# Patient Record
Sex: Female | Born: 1995 | Race: Black or African American | Hispanic: No | Marital: Single
Health system: Southern US, Community
[De-identification: ages and names within clinical notes are randomized; demographics above are authoritative.]

## PROBLEM LIST (undated history)

## (undated) DIAGNOSIS — Z789 Other specified health status: Secondary | ICD-10-CM

## (undated) DIAGNOSIS — T7840XA Allergy, unspecified, initial encounter: Secondary | ICD-10-CM

## (undated) HISTORY — PX: HERNIA REPAIR: SHX51

## (undated) HISTORY — DX: Allergy, unspecified, initial encounter: T78.40XA

---

## 2014-05-02 ENCOUNTER — Ambulatory Visit (INDEPENDENT_AMBULATORY_CARE_PROVIDER_SITE_OTHER): Payer: BC Managed Care – PPO | Admitting: Sports Medicine

## 2014-05-02 VITALS — BP 114/68 | HR 71 | Temp 99.0°F | Resp 17 | Ht 65.5 in | Wt 131.0 lb

## 2014-05-02 DIAGNOSIS — N3001 Acute cystitis with hematuria: Secondary | ICD-10-CM

## 2014-05-02 LAB — POCT URINALYSIS DIPSTICK
Bilirubin, UA: NEGATIVE
Glucose, UA: NEGATIVE
NITRITE UA: NEGATIVE
PH UA: 6
PROTEIN UA: 100
Spec Grav, UA: 1.025
Urobilinogen, UA: 1

## 2014-05-02 LAB — POCT UA - MICROSCOPIC ONLY
CRYSTALS, UR, HPF, POC: NEGATIVE
Casts, Ur, LPF, POC: NEGATIVE
Mucus, UA: NEGATIVE
YEAST UA: NEGATIVE

## 2014-05-02 MED ORDER — SULFAMETHOXAZOLE-TRIMETHOPRIM 800-160 MG PO TABS: 1.0000 | ORAL_TABLET | Freq: Two times a day (BID) | ORAL | Status: AC

## 2014-05-02 NOTE — Progress Notes (Signed)
   Subjective:    Patient ID: Rebecca Curtis, female    DOB: 20-Sep-1995, 18 y.o.   MRN: [id number]  HPI Lemont Fillersatyana is a 18yo female who presents with dysuria. Onset was 2 days ago, characterized by dysuria, urgency, and hesitancy.  Possible hematuria as well. No recent antibiotic use. No other vaginal discharge. No vaginal spotting.  LMP 04/19/14. No OCPs, but sexually active. Uses condoms. No STD concern.  No back pain, flank pain, fevers, or chills. Mild nausea. No prior history of UTIs.  Allergies: PCN (throat swelling). Past medical history, social history, medications, and allergies were reviewed and are up to date in the chart.  Review of Systems 7 point review of systems was performed and was otherwise negative unless noted in the history of present illness.     Objective:   Physical Exam BP 114/68  Pulse 71  Temp(Src) 99 F (37.2 C) (Oral)  Resp 17  Ht 5' 5.5" (1.664 m)  Wt 131 lb (59.421 kg)  BMI 21.46 kg/m2  SpO2 100%  LMP 04/19/2014 General appearance: alert, cooperative and appears stated age Back: symmetric, no curvature. ROM normal. No CVA tenderness. Abdomen: soft, non-tender; bowel sounds normal; no masses,  no organomegaly and mild suprapubic tenderness Pulses: 2+ and symmetric Skin: Skin color, texture, turgor normal. No rashes or lesions Lymph nodes: Cervical, supraclavicular, and axillary nodes normal.  UA/Urine dip: + bacteria, +LE, neg nitrite, +WBCs, +RBCs Urine Culture- ordered    Assessment & Plan:  1. UTI, uncomplicated -Urine culture ordered -Rx Bactrim DS bid x 3 days -Discussed precautions, prevention, and reasons to call or RTC.  Dr. Joellyn HaffPick-Jacobs, DO Sports Medicine Fellow

## 2014-05-02 NOTE — Patient Instructions (Signed)

## 2014-05-04 LAB — URINE CULTURE
Colony Count: NO GROWTH
Organism ID, Bacteria: NO GROWTH

## 2014-11-21 ENCOUNTER — Ambulatory Visit (INDEPENDENT_AMBULATORY_CARE_PROVIDER_SITE_OTHER): Payer: BLUE CROSS/BLUE SHIELD | Admitting: Family Medicine

## 2014-11-21 ENCOUNTER — Ambulatory Visit (INDEPENDENT_AMBULATORY_CARE_PROVIDER_SITE_OTHER): Payer: BLUE CROSS/BLUE SHIELD

## 2014-11-21 VITALS — BP 135/90 | HR 80 | Temp 98.8°F | Ht 65.0 in | Wt 131.5 lb

## 2014-11-21 DIAGNOSIS — R079 Chest pain, unspecified: Secondary | ICD-10-CM

## 2014-11-21 NOTE — Progress Notes (Addendum)
Subjective:    Patient ID: Rebecca Curtis, female    DOB: [birth year], 19 y.o.   MRN: [id number] This chart was scribed for Elvina SidleKurt Lauenstein, MD by [place] Peace, ED Scribe. The patient was seen in [facility]07. The patient's care was started at 7:14 PM.  Chief Complaint  Patient presents with   Chest Pain   Nausea    took pepto this am   Shortness of Breath   Arm Pain    left     HPI  HPI Comments: Rebecca Needyatyana Leduc is a 19 y.o. female who presents to the Emergency Department complaining of new onset of left-sided chest pain with SOB and left arm pain/numbness that started today. Pt describes chest pain as feeling as if "someone is sitting on her chest". She denies any recent changes in activity, recent traumas, or mechanisms of injury that may be responsible for current pain. Pt explains that all she has done today was go to work at [employer]. Pt further reports episode of nausea and vomiting this morning around 11:00 AM after eating breakfast 2 hrs before. She currently denies any nausea, vomiting, abdominal pain, or leg pain. Pt notes she tried taking Pepto-Bismol to treat symptoms. She denies any possibility that she may be pregnant.   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 Pt is non-smoker.   Past Medical History  Diagnosis Date   Allergy    History   Social History   Marital Status: Single    Spouse Name: N/A   Number of Children: N/A   Years of Education: N/A   Occupational History   Not on file.   Social History Main Topics   Smoking status: Never Smoker    Smokeless tobacco: Never Used   Alcohol Use: No   Drug Use: No   Sexual Activity: No   Other Topics Concern   Not on file   Social History Narrative   Allergies  Allergen Reactions   Penicillins Anaphylaxis    Heart stopped    Prior to Admission medications   Not on File     Review of Systems  Respiratory: Positive for shortness of breath.   Cardiovascular: Positive for  chest pain.  Gastrointestinal: Negative for nausea, vomiting and abdominal pain.       Objective:   Physical Exam  Constitutional: She is oriented to person, place, and time. She appears well-developed and well-nourished. No distress.  Thin young adult woman in no distress  HENT:  Head: Normocephalic and atraumatic.  Eyes: Conjunctivae and EOM are normal. Pupils are equal, round, and reactive to light.  Neck: Normal range of motion. Neck supple. No tracheal deviation present.  Cardiovascular: Normal rate.   Pulmonary/Chest: Effort normal. No respiratory distress. She exhibits tenderness.  Left, upper chest tenderness.   Abdominal: Soft. Bowel sounds are normal.  Musculoskeletal: Normal range of motion.  Neurological: She is alert and oriented to person, place, and time.  Skin: Skin is warm and dry.  Psychiatric: She has a normal mood and affect. Her behavior is normal. Judgment and thought content normal.  Nursing note and vitals reviewed.  EKG normal sinus rhythm.   7:20 PM- Treatment plan was discussed with patient who verbalizes understanding and agrees.   [facility] reading (PRIMARY) by  Dr. Milus GlazierLauenstein:  Normal chest film.      Assessment & Plan:   This chart was scribed in my presence and reviewed by me personally.    ICD-9-CM ICD-10-CM  1. Chest pain, unspecified chest pain type 786.50 R07.9 EKG 12-Lead     DG Chest 2 View   Ibuprofen for now, return if not better in 2-3 days  Signed, Elvina SidleKurt Lauenstein, MD

## 2014-11-21 NOTE — Patient Instructions (Signed)
The EKG, this goal exam, and chest x-ray also suggests that you have a muscular cause for the pain. This should respond to ibuprofen when taken 3 times a day for 2-3 days. If the pain is persisting or changes significantly, please return for further evaluation   Chest Wall Pain Chest wall pain is pain in or around the bones and muscles of your chest. It may take up to 6 weeks to get better. It may take longer if you must stay physically active in your work and activities.  CAUSES  Chest wall pain may happen on its own. However, it may be caused by:  A viral illness like the flu.  Injury.  Coughing.  Exercise.  Arthritis.  Fibromyalgia.  Shingles. HOME CARE INSTRUCTIONS   Avoid overtiring physical activity. Try not to strain or perform activities that cause pain. This includes any activities using your chest or your abdominal and side muscles, especially if heavy weights are used.  Put ice on the sore area.  Put ice in a plastic bag.  Place a towel between your skin and the bag.  Leave the ice on for 15-20 minutes per hour while awake for the first 2 days.  Only take over-the-counter or prescription medicines for pain, discomfort, or fever as directed by your caregiver. SEEK IMMEDIATE MEDICAL CARE IF:   Your pain increases, or you are very uncomfortable.  You have a fever.  Your chest pain becomes worse.  You have new, unexplained symptoms.  You have nausea or vomiting.  You feel sweaty or lightheaded.  You have a cough with phlegm (sputum), or you cough up blood. MAKE SURE YOU:   Understand these instructions.  Will watch your condition.  Will get help right away if you are not doing well or get worse. Document Released: 06/30/2005 Document Revised: 09/22/2011 Document Reviewed: 02/24/2011 [facility] Patient Information 2015 [place]. This information is not intended to replace advice given to you by your health care provider. Make sure you discuss any  questions you have with your health care provider.

## 2015-01-02 ENCOUNTER — Ambulatory Visit (INDEPENDENT_AMBULATORY_CARE_PROVIDER_SITE_OTHER): Payer: BLUE CROSS/BLUE SHIELD | Admitting: Physician Assistant

## 2015-01-02 VITALS — BP 118/58 | HR 86 | Temp 98.8°F | Resp 16 | Ht 65.5 in | Wt 128.8 lb

## 2015-01-02 DIAGNOSIS — N898 Other specified noninflammatory disorders of vagina: Secondary | ICD-10-CM | POA: Diagnosis not present

## 2015-01-02 DIAGNOSIS — Z30011 Encounter for initial prescription of contraceptive pills: Secondary | ICD-10-CM

## 2015-01-02 DIAGNOSIS — Z309 Encounter for contraceptive management, unspecified: Secondary | ICD-10-CM | POA: Diagnosis not present

## 2015-01-02 LAB — POCT WET PREP WITH KOH
Clue Cells Wet Prep HPF POC: NEGATIVE
KOH Prep POC: NEGATIVE
Trichomonas, UA: NEGATIVE
WBC Wet Prep HPF POC: NEGATIVE
YEAST WET PREP PER HPF POC: NEGATIVE

## 2015-01-02 LAB — POCT URINE PREGNANCY: PREG TEST UR: NEGATIVE

## 2015-01-02 MED ORDER — NORGESTIM-ETH ESTRAD TRIPHASIC 0.18/0.215/0.25 MG-35 MCG PO TABS
1.0000 | ORAL_TABLET | Freq: Every day | ORAL | Status: DC
Start: 2015-01-02 — End: 2017-05-14

## 2015-01-02 NOTE — Progress Notes (Signed)
[facility] [address], [place] [address] [phone]  Date:  01/02/2015   Name:  Rebecca Curtis   DOB:  [birth year]   MRN:  [id number]  PCP:  No PCP Per Patient    History of Present Illness:  Rebecca Curtis is a 19 y.o. female patient who presents to [facility] for chief complaint of abnormal vaginal bleeding. Patient states that today she has had a pinkish creamy discharge.  It is odorless and non-pruritic.  She has never had this before.  It is not enough for a need to use a panty liner, or a sanitary napkin.  She has no abdominal pain.  She did have some nausea this morning.  Last LMP was 3 weeks ago, which she describes was really light.  She states that she is on the birth control Orsythia, but discontinued about 1.5 months ago, due to a change in mood.  This is her first birth control medication, and she has been on this for about 3 months.  She is currently sexually active, to which she continues even after stopping her ocp.  Last sexual encounter was about 2 weeks ago.  She took 2 pregnancy test at her home, which were negative.        There are no active problems to display for this patient.   Past Medical History  Diagnosis Date  . Allergy     Past Surgical History  Procedure Laterality Date  . Hernia repair      History  Substance Use Topics  . Smoking status: Never Smoker   . Smokeless tobacco: Never Used  . Alcohol Use: No    Family History  Problem Relation Age of Onset  . Diabetes Father   . Hyperlipidemia Father   . Diabetes Brother   . Diabetes Maternal Grandmother   . Hyperlipidemia Maternal Grandmother   . Stroke Maternal Grandmother   . Heart disease Paternal Grandmother   . Hyperlipidemia Paternal Grandmother   . Stroke Paternal Grandmother   . Cancer Paternal Grandfather     Allergies  Allergen Reactions  . Penicillins Anaphylaxis    Heart stopped     Medication list has been reviewed and updated.  No current  outpatient prescriptions on file prior to visit.   No current facility-administered medications on file prior to visit.    ROS ROS otherwise unremarkable unless listed above.   Physical Examination: BP 118/58 mmHg  Pulse 86  Temp(Src) 98.8 F (37.1 C) (Oral)  Resp 16  Ht 5' 5.5" (1.664 m)  Wt 128 lb 12.8 oz (58.423 kg)  BMI 21.10 kg/m2  SpO2 93%  LMP 12/13/2014 Ideal Body Weight: Weight in (lb) to have BMI = 25: 152.2  Physical Exam  Constitutional: She appears well-developed and well-nourished. No distress.  Eyes: Pupils are equal, round, and reactive to light.  Cardiovascular: Normal rate, regular rhythm and normal heart sounds.  Exam reveals no friction rub.   No murmur heard. Pulmonary/Chest: Effort normal and breath sounds normal. No respiratory distress. She has no wheezes.  Abdominal: Soft. Bowel sounds are normal. She exhibits no distension. There is no tenderness.  Neurological: She is alert.  Skin: Skin is warm and dry. She is not diaphoretic.  Psychiatric: She has a normal mood and affect. Her behavior is normal.   Results for orders placed or performed in visit on 01/02/15  POCT urine pregnancy  Result Value Ref Range   Preg Test, Ur Negative Negative  POCT Wet  Prep with KOH  Result Value Ref Range   Trichomonas, UA Negative    Clue Cells Wet Prep HPF POC neg    Epithelial Wet Prep HPF POC Moderate Few, Moderate, Many   Yeast Wet Prep HPF POC neg    Bacteria Wet Prep HPF POC Moderate (A) Few   RBC Wet Prep HPF POC TNTC    WBC Wet Prep HPF POC neg    KOH Prep POC Negative      Assessment and Plan: 19 year old female is here today for chief complaint of abnormal bleeding.  Patient did her wet prep, which she states appeared to be her true period starting.  This is likely due to stopping her birth control, and hormone irregularity.  I have advised her that if her period continues, appears abnormal in quantity, abdominal pain, or dizziness, to return to the  clinic.    Vaginal discharge  BCP (birth control pills) initiation - Plan: POCT urine pregnancy, POCT Wet Prep with KOH, GC/Chlamydia Probe Amp, Norgestimate-Ethinyl Estradiol Triphasic 0.18/0.215/0.25 MG-35 MCG tablet  past his g Trena Platt, PA-C [facility] 6/21/20169:12 PM

## 2015-01-02 NOTE — Patient Instructions (Signed)
Dysfunctional Uterine Bleeding Normally, menstrual periods begin between ages 11 to 17 in young women. A normal menstrual cycle/period may begin every 23 days up to 35 days and lasts from 1 to 7 days. Around 12 to 14 days before your menstrual period starts, ovulation (ovary produces an egg) occurs. When counting the time between menstrual periods, count from the first day of bleeding of the previous period to the first day of bleeding of the next period. Dysfunctional (abnormal) uterine bleeding is bleeding that is different from a normal menstrual period. Your periods may come earlier or later than usual. They may be lighter, have blood clots or be heavier. You may have bleeding between periods, or you may skip one period or more. You may have bleeding after sexual intercourse, bleeding after menopause, or no menstrual period. CAUSES   Pregnancy (normal, miscarriage, tubal).  IUDs (intrauterine device, birth control).  Birth control pills.  Hormone treatment.  Menopause.  Infection of the cervix.  Blood clotting problems.  Infection of the inside lining of the uterus.  Endometriosis, inside lining of the uterus growing in the pelvis and other female organs.  Adhesions (scar tissue) inside the uterus.  Obesity or severe weight loss.  Uterine polyps inside the uterus.  Cancer of the vagina, cervix, or uterus.  Ovarian cysts or polycystic ovary syndrome.  Medical problems (diabetes, thyroid disease).  Uterine fibroids (noncancerous tumor).  Problems with your female hormones.  Endometrial hyperplasia, very thick lining and enlarged cells inside of the uterus.  Medicines that interfere with ovulation.  Radiation to the pelvis or abdomen.  Chemotherapy. DIAGNOSIS   Your doctor will discuss the history of your menstrual periods, medicines you are taking, changes in your weight, stress in your life, and any medical problems you may have.  Your doctor will do a physical  and pelvic examination.  Your doctor may want to perform certain tests to make a diagnosis, such as:  Pap test.  Blood tests.  Cultures for infection.  CT scan.  Ultrasound.  Hysteroscopy.  Laparoscopy.  MRI.  Hysterosalpingography.  D and C.  Endometrial biopsy. TREATMENT  Treatment will depend on the cause of the dysfunctional uterine bleeding (DUB). Treatment may include:  Observing your menstrual periods for a couple of months.  Prescribing medicines for medical problems, including:  Antibiotics.  Hormones.  Birth control pills.  Removing an IUD (intrauterine device, birth control).  Surgery:  D and C (scrape and remove tissue from inside the uterus).  Laparoscopy (examine inside the abdomen with a lighted tube).  Uterine ablation (destroy lining of the uterus with electrical current, laser, heat, or freezing).  Hysteroscopy (examine cervix and uterus with a lighted tube).  Hysterectomy (remove the uterus). HOME CARE INSTRUCTIONS   If medicines were prescribed, take exactly as directed. Do not change or switch medicines without consulting your caregiver.  Long term heavy bleeding may result in iron deficiency. Your caregiver may have prescribed iron pills. They help replace the iron that your body lost from heavy bleeding. Take exactly as directed.  Do not take aspirin or medicines that contain aspirin one week before or during your menstrual period. Aspirin may make the bleeding worse.  If you need to change your sanitary pad or tampon more than once every 2 hours, stay in bed with your feet elevated and a cold pack on your lower abdomen. Rest as much as possible, until the bleeding stops or slows down.  Eat well-balanced meals. Eat foods high in iron. Examples   are:  Leafy green vegetables.  Whole-grain breads and cereals.  Eggs.  Meat.  Liver.  Do not try to lose weight until the abnormal bleeding has stopped and your blood iron level is  back to normal. Do not lift more than ten pounds or do strenuous activities when you are bleeding.  For a couple of months, make note on your calendar, marking the start and ending of your period, and the type of bleeding (light, medium, heavy, spotting, clots or missed periods). This is for your caregiver to better evaluate your problem. SEEK MEDICAL CARE IF:   You develop nausea (feeling sick to your stomach) and vomiting, dizziness, or diarrhea while you are taking your medicine.  You are getting lightheaded or weak.  You have any problems that may be related to the medicine you are taking.  You develop pain with your DUB.  You want to remove your IUD.  You want to stop or change your birth control pills or hormones.  You have any type of abnormal bleeding mentioned above.  You are over 16 years old and have not had a menstrual period yet.  You are 19 years old and you are still having menstrual periods.  You have any of the symptoms mentioned above.  You develop a rash. SEEK IMMEDIATE MEDICAL CARE IF:   An oral temperature above 102 F (38.9 C) develops.  You develop chills.  You are changing your sanitary pad or tampon more than once an hour.  You develop abdominal pain.  You pass out or faint. Document Released: 06/27/2000 Document Revised: 09/22/2011 Document Reviewed: 05/29/2009 ExitCare Patient Information 2015 [employer]. This information is not intended to replace advice given to you by your health care provider. Make sure you discuss any questions you have with your health care provider.  

## 2017-05-14 ENCOUNTER — Encounter (HOSPITAL_COMMUNITY): Payer: Self-pay | Admitting: Radiology

## 2017-05-14 ENCOUNTER — Emergency Department (HOSPITAL_COMMUNITY): Payer: BLUE CROSS/BLUE SHIELD

## 2017-05-14 ENCOUNTER — Emergency Department (HOSPITAL_COMMUNITY)
Admission: EM | Admit: 2017-05-14 | Discharge: 2017-05-14 | Disposition: A | Discharge status: Home / Self Care | Payer: BLUE CROSS/BLUE SHIELD | Attending: Emergency Medicine | Admitting: Emergency Medicine

## 2017-05-14 DIAGNOSIS — R1011 Right upper quadrant pain: Secondary | ICD-10-CM | POA: Diagnosis present

## 2017-05-14 DIAGNOSIS — N390 Urinary tract infection, site not specified: Secondary | ICD-10-CM

## 2017-05-14 LAB — COMPREHENSIVE METABOLIC PANEL
ALT: 12 U/L — ABNORMAL LOW (ref 14–54)
ANION GAP: 9 (ref 5–15)
AST: 17 U/L (ref 15–41)
Albumin: 3.5 g/dL (ref 3.5–5.0)
Alkaline Phosphatase: 55 U/L (ref 38–126)
BILIRUBIN TOTAL: 0.7 mg/dL (ref 0.3–1.2)
BUN: 6 mg/dL (ref 6–20)
CALCIUM: 9.1 mg/dL (ref 8.9–10.3)
CO2: 23 mmol/L (ref 22–32)
Chloride: 105 mmol/L (ref 101–111)
Creatinine, Ser: 0.6 mg/dL (ref 0.44–1.00)
GLUCOSE: 88 mg/dL (ref 65–99)
POTASSIUM: 3.9 mmol/L (ref 3.5–5.1)
Sodium: 137 mmol/L (ref 135–145)
Total Protein: 7 g/dL (ref 6.5–8.1)

## 2017-05-14 LAB — CBC
HEMATOCRIT: 32.2 % — AB (ref 36.0–46.0)
HEMOGLOBIN: 10.4 g/dL — AB (ref 12.0–15.0)
MCH: 27.4 pg (ref 26.0–34.0)
MCHC: 32.3 g/dL (ref 30.0–36.0)
MCV: 84.7 fL (ref 78.0–100.0)
Platelets: 281 10*3/uL (ref 150–400)
RBC: 3.8 MIL/uL — AB (ref 3.87–5.11)
RDW: 14 % (ref 11.5–15.5)
WBC: 9.2 10*3/uL (ref 4.0–10.5)

## 2017-05-14 LAB — URINALYSIS, ROUTINE W REFLEX MICROSCOPIC
BILIRUBIN URINE: NEGATIVE
GLUCOSE, UA: NEGATIVE mg/dL
KETONES UR: 5 mg/dL — AB
NITRITE: NEGATIVE
PROTEIN: NEGATIVE mg/dL
Specific Gravity, Urine: 1.018 (ref 1.005–1.030)
pH: 5 (ref 5.0–8.0)

## 2017-05-14 LAB — POC URINE PREG, ED: PREG TEST UR: NEGATIVE

## 2017-05-14 LAB — LIPASE, BLOOD: Lipase: 18 U/L (ref 11–51)

## 2017-05-14 MED ORDER — FENTANYL CITRATE (PF) 100 MCG/2ML IJ SOLN
50.0000 ug | Freq: Once | INTRAMUSCULAR | Status: AC
  Administered 2017-05-14: 50 ug via INTRAVENOUS
  Filled 2017-05-14: qty 2

## 2017-05-14 MED ORDER — ONDANSETRON HCL 4 MG PO TABS: 4.0000 mg | ORAL_TABLET | Freq: Three times a day (TID) | ORAL | 0 refills | Status: AC | PRN

## 2017-05-14 MED ORDER — CIPROFLOXACIN IN D5W 400 MG/200ML IV SOLN
400.0000 mg | Freq: Once | INTRAVENOUS | Status: AC
  Administered 2017-05-14: 400 mg via INTRAVENOUS
  Filled 2017-05-14: qty 200

## 2017-05-14 MED ORDER — HYDROCODONE-ACETAMINOPHEN 5-325 MG PO TABS: 1.0000 | ORAL_TABLET | Freq: Four times a day (QID) | ORAL | 0 refills | Status: DC | PRN

## 2017-05-14 MED ORDER — KETOROLAC TROMETHAMINE 30 MG/ML IJ SOLN
15.0000 mg | Freq: Once | INTRAMUSCULAR | Status: AC
  Administered 2017-05-14: 15 mg via INTRAVENOUS
  Filled 2017-05-14: qty 1

## 2017-05-14 MED ORDER — ONDANSETRON 4 MG PO TBDP: 4.0000 mg | ORAL_TABLET | Freq: Once | ORAL | Status: DC | PRN

## 2017-05-14 MED ORDER — CIPROFLOXACIN HCL 500 MG PO TABS: 500.0000 mg | ORAL_TABLET | Freq: Two times a day (BID) | ORAL | 0 refills | Status: DC

## 2017-05-14 MED ORDER — IOPAMIDOL (ISOVUE-300) INJECTION 61%
INTRAVENOUS | Status: AC
  Administered 2017-05-14: 100 mL
  Filled 2017-05-14: qty 100

## 2017-05-14 NOTE — ED Notes (Signed)
Got patient into a gown patient is resting with call bell in reach 

## 2017-05-14 NOTE — ED Notes (Signed)
Patient transported to CT 

## 2017-05-14 NOTE — ED Triage Notes (Signed)
The pt has had abd pain for 4 days  She weas seen at [facility] yesterday  Still having pain  lmp October 17th

## 2017-05-14 NOTE — ED Provider Notes (Signed)
Emergency Department Provider Note   I have reviewed the triage vital signs and the nursing notes.   HISTORY  Chief Complaint Abdominal Pain   HPI Rebecca Curtis is a 21 y.o. female without significant past medical history presents to the emergency room today with a 4-5 days of worsening right upper quadrant and right flank pain.  Patient states that she takes ibuprofen for this at home it seems to help but continually gets more more prominent to the point where she was crying this morning because of the pain.  She states she was seen in urgent care yesterday had a negative workup and was sent home.  She has mild nausea and some mild urinary symptoms but otherwise no vaginal discharge or vomiting or diarrhea or constipation.  No recent fevers or cough.  States the pain is made worse with movement and with taking deep breaths.  No history of gallbladder problems that she knows of.  No rashes.  No trauma.  No other associated or modifying symptoms.   Past Medical History:  Diagnosis Date  . Allergy     There are no active problems to display for this patient.   Past Surgical History:  Procedure Laterality Date  . HERNIA REPAIR      Current Outpatient Rx  . Order #: [id number] Class: Historical Med  . Order #: [id number] Class: Print  . Order #: [id number] Class: Print  . Order #: [id number] Class: Print    Allergies Penicillins  Family History  Problem Relation Age of Onset  . Diabetes Father   . Hyperlipidemia Father   . Diabetes Brother   . Diabetes Maternal Grandmother   . Hyperlipidemia Maternal Grandmother   . Stroke Maternal Grandmother   . Heart disease Paternal Grandmother   . Hyperlipidemia Paternal Grandmother   . Stroke Paternal Grandmother   . Cancer Paternal Grandfather     Social History Social History  Substance Use Topics  . Smoking status: Never Smoker  . Smokeless tobacco: Never Used  . Alcohol use No    Review of Systems  All other systems  negative except as documented in the HPI. All pertinent positives and negatives as reviewed in the HPI. ____________________________________________   PHYSICAL EXAM:  VITAL SIGNS: ED Triage Vitals [05/14/17 0654]  Enc Vitals Group     BP 134/63     Pulse Rate 80     Resp 18     Temp 99.1 F (37.3 C)     Temp Source Oral     SpO2 100 %     Weight      Height      Head Circumference      Peak Flow      Pain Score      Pain Loc      Pain Edu?      Excl. in GC?     Constitutional: Alert and oriented. Well appearing and in no acute distress. Eyes: Conjunctivae are normal. PERRL. EOMI. Head: Atraumatic. Nose: No congestion/rhinnorhea. Mouth/Throat: Mucous membranes are moist.  Oropharynx non-erythematous. Neck: No stridor.  No meningeal signs.   Cardiovascular: Normal rate, regular rhythm. Good peripheral circulation. Grossly normal heart sounds.  Right chest tenderness. Respiratory: Normal respiratory effort.  No retractions. Lungs CTAB. Gastrointestinal: Mild suprapubic tenderness to palpation.  No distention.  Musculoskeletal: No lower extremity tenderness nor edema. No gross deformities of extremities. Neurologic:  Normal speech and language. No gross focal neurologic deficits are appreciated.  Skin:  Skin is warm, dry and intact.  No rash noted.  ____________________________________________   LABS (all labs ordered are listed, but only abnormal results are displayed)  Labs Reviewed  COMPREHENSIVE METABOLIC PANEL - Abnormal; Notable for the following:       Result Value   ALT 12 (*)    All other components within normal limits  CBC - Abnormal; Notable for the following:    RBC 3.80 (*)    Hemoglobin 10.4 (*)    HCT 32.2 (*)    All other components within normal limits  URINALYSIS, ROUTINE W REFLEX MICROSCOPIC - Abnormal; Notable for the following:    APPearance HAZY (*)    Hgb urine dipstick MODERATE (*)    Ketones, ur 5 (*)    Leukocytes, UA MODERATE (*)     Bacteria, UA MANY (*)    Squamous Epithelial / LPF 0-5 (*)    All other components within normal limits  URINE CULTURE  LIPASE, BLOOD  POC URINE PREG, ED   ____________________________________________  RADIOLOGY  Ct Abdomen Pelvis W Contrast  Result Date: 05/14/2017 CLINICAL DATA:  Right upper quadrant pain for 5 days. Nausea for 3 days. Constipation for 2 days. EXAM: CT ABDOMEN AND PELVIS WITH CONTRAST TECHNIQUE: Multidetector CT imaging of the abdomen and pelvis was performed using the standard protocol following bolus administration of intravenous contrast. CONTRAST:  ISOVUE-300 IOPAMIDOL (ISOVUE-300) INJECTION 61% COMPARISON:  None. FINDINGS: Lower chest: No acute abnormality. Hepatobiliary: No focal liver abnormality is seen. No gallstones, gallbladder wall thickening, or biliary dilatation. Pancreas: Unremarkable. No pancreatic ductal dilatation or surrounding inflammatory changes. Spleen: Normal in size without focal abnormality. Adrenals/Urinary Tract: Adrenal glands are unremarkable. Kidneys are normal, without renal calculi, focal lesion, or hydronephrosis. Bladder is unremarkable. Stomach/Bowel: Stomach is within normal limits. Appendix appears normal. No evidence of bowel wall thickening, distention, or inflammatory changes. Moderate amount of stool in the ascending and transverse colon. Vascular/Lymphatic: No significant vascular findings are present. No enlarged abdominal or pelvic lymph nodes. Reproductive: Uterus and bilateral adnexa are unremarkable. Other: Small amount of pelvic free fluid likely physiologic. No abdominal wall hernia. No fluid collection or hematoma. Musculoskeletal: No acute osseous abnormality. No lytic or sclerotic osseous lesion. IMPRESSION: 1. No acute abdominal or pelvic pathology. 2. Small amount of pelvic free fluid likely physiologic. 3. Moderate amount of stool in the ascending and transverse colon. Electronically Signed   By: Elige Ko   On:  05/14/2017 10:51    ____________________________________________   PROCEDURES  Procedure(s) performed:   Procedures   ____________________________________________   INITIAL IMPRESSION / ASSESSMENT AND PLAN / ED COURSE  Pertinent labs & imaging results that were available during my care of the patient were reviewed by me and considered in my medical decision making (see chart for details).  Patient with suprapubic tenderness, bacteria and leukocytes in her urine likely urinary tract infection.  However she has pain up in her right upper quadrant and right flank possibly pyelonephritis.  Will CT scan to evaluate for the same. abx and pain meds as well.  Ct ok. Likely UTI with early pyelo. Anaphylactic allergy to PCN so will avoid cephalosporin. While cipro is suboptimal 2/2 resistance nitrofurantoin is not appropriate if concerned for pyelo, so will start cipro, send culture.  ____________________________________________  FINAL CLINICAL IMPRESSION(S) / ED DIAGNOSES  Final diagnoses:  Urinary tract infection without hematuria, site unspecified     MEDICATIONS GIVEN DURING THIS VISIT:  Medications  ondansetron (ZOFRAN-ODT) disintegrating tablet 4 mg (not administered)  ciprofloxacin (CIPRO) IVPB 400  mg (0 mg Intravenous Stopped 05/14/17 1117)  fentaNYL (SUBLIMAZE) injection 50 mcg (50 mcg Intravenous Given 05/14/17 0956)  ketorolac (TORADOL) 30 MG/ML injection 15 mg (15 mg Intravenous Given 05/14/17 0956)  iopamidol (ISOVUE-300) 61 % injection (100 mLs  Contrast Given 05/14/17 1031)     NEW OUTPATIENT MEDICATIONS STARTED DURING THIS VISIT:  Discharge Medication List as of 05/14/2017 11:24 AM    START taking these medications   Details  ciprofloxacin (CIPRO) 500 MG tablet Take 1 tablet (500 mg total) by mouth 2 (two) times daily., Starting Thu 05/14/2017, Print    HYDROcodone-acetaminophen (NORCO/VICODIN) 5-325 MG tablet Take 1 tablet by mouth every 6 (six) hours as needed  for moderate pain., Starting Thu 05/14/2017, Print    ondansetron (ZOFRAN) 4 MG tablet Take 1 tablet (4 mg total) by mouth every 8 (eight) hours as needed for nausea or vomiting., Starting Thu 05/14/2017, Print        Note:  This document was prepared using Dragon voice recognition software and may include unintentional dictation errors.   Marily Memos, MD 05/14/17 [phone]

## 2017-05-15 LAB — URINE CULTURE

## 2019-02-05 ENCOUNTER — Other Ambulatory Visit: Payer: Self-pay

## 2019-02-05 ENCOUNTER — Emergency Department (HOSPITAL_BASED_OUTPATIENT_CLINIC_OR_DEPARTMENT_OTHER)
Admission: EM | Admit: 2019-02-05 | Discharge: 2019-02-05 | Disposition: A | Discharge status: Home / Self Care | Payer: BLUE CROSS/BLUE SHIELD | Attending: Emergency Medicine | Admitting: Emergency Medicine

## 2019-02-05 ENCOUNTER — Encounter (HOSPITAL_BASED_OUTPATIENT_CLINIC_OR_DEPARTMENT_OTHER): Payer: Self-pay | Admitting: Emergency Medicine

## 2019-02-05 DIAGNOSIS — N760 Acute vaginitis: Secondary | ICD-10-CM

## 2019-02-05 DIAGNOSIS — Z7722 Contact with and (suspected) exposure to environmental tobacco smoke (acute) (chronic): Secondary | ICD-10-CM | POA: Insufficient documentation

## 2019-02-05 DIAGNOSIS — B9689 Other specified bacterial agents as the cause of diseases classified elsewhere: Secondary | ICD-10-CM

## 2019-02-05 DIAGNOSIS — Z113 Encounter for screening for infections with a predominantly sexual mode of transmission: Secondary | ICD-10-CM | POA: Insufficient documentation

## 2019-02-05 DIAGNOSIS — Z711 Person with feared health complaint in whom no diagnosis is made: Secondary | ICD-10-CM

## 2019-02-05 LAB — URINALYSIS, ROUTINE W REFLEX MICROSCOPIC
Glucose, UA: NEGATIVE mg/dL
Ketones, ur: 40 mg/dL — AB
Leukocytes,Ua: NEGATIVE
Nitrite: NEGATIVE
Protein, ur: 30 mg/dL — AB
Specific Gravity, Urine: 1.025 (ref 1.005–1.030)
pH: 6 (ref 5.0–8.0)

## 2019-02-05 LAB — WET PREP, GENITAL
Sperm: NONE SEEN
Trich, Wet Prep: NONE SEEN
Yeast Wet Prep HPF POC: NONE SEEN

## 2019-02-05 LAB — URINALYSIS, MICROSCOPIC (REFLEX)

## 2019-02-05 LAB — PREGNANCY, URINE: Preg Test, Ur: NEGATIVE

## 2019-02-05 MED ORDER — METRONIDAZOLE 500 MG PO TABS: 500.0000 mg | ORAL_TABLET | Freq: Two times a day (BID) | ORAL | 0 refills | Status: DC

## 2019-02-05 NOTE — Discharge Instructions (Signed)
Take the antibiotics as prescribed.  Do not drink alcohol while taking this medicine for 48 hours after he stopped taking it.  You were tested for gonorrhea, chlamydia, HIV and syphilis.  These test will take 48 hours approximately to return.  If any of these are positive you will need to tell your partners.  You also need to seek reevaluation for treatment.  Follow-up with OB/GYN if you continue having symptoms.  I referred you to a women's outpatient clinic.

## 2019-02-05 NOTE — ED Triage Notes (Signed)
Pt here with vaginal swelling x 2 weeks and requesting "to have her hormone levels checked"

## 2019-02-05 NOTE — ED Provider Notes (Signed)
[facility] EMERGENCY DEPARTMENT Provider Note   CSN: [id number] Arrival date & time: 02/05/19  1222  History   Chief Complaint Chief Complaint  Patient presents with  . Groin Swelling   HPI Rebecca Curtis is a 23 y.o. female with no significant past medical history who presents for evaluation of dental swelling.  Patient states "I feel like the inside my vagina is swollen."  Patient states she is sexually active and does not use protection.  Has had white discharge over the last 2 weeks.  Denies fever, chills, nausea, vomiting, abdominal pain, diarrhea, dysuria, pelvic pain, labial swelling.  Denies history of abscesses or bartholin cyst.  Denies additional aggravating or alleviating factors.  Has not taken anything for her symptoms.  Patient requesting STD testing. Denies additional aggravating or alleviating factors.    History obtained from patient and past medical records.  No interpreter was used.     HPI  Past Medical History:  Diagnosis Date  . Allergy     There are no active problems to display for this patient.   Past Surgical History:  Procedure Laterality Date  . HERNIA REPAIR       OB History   No obstetric history on file.      Home Medications    Prior to Admission medications   Medication Sig Start Date End Date Taking? Authorizing Provider  ciprofloxacin (CIPRO) 500 MG tablet Take 1 tablet (500 mg total) by mouth 2 (two) times daily. 05/14/17   Mesner, Barbara CowerJason, MD  HYDROcodone-acetaminophen (NORCO/VICODIN) 5-325 MG tablet Take 1 tablet by mouth every 6 (six) hours as needed for moderate pain. 05/14/17   Mesner, Barbara CowerJason, MD  ibuprofen (ADVIL,MOTRIN) 200 MG tablet Take 800 mg by mouth every 6 (six) hours as needed for moderate pain.    [provider]  metroNIDAZOLE (FLAGYL) 500 MG tablet Take 1 tablet (500 mg total) by mouth 2 (two) times daily. 02/05/19   Arletha Marschke A, PA-C  ondansetron (ZOFRAN) 4 MG tablet Take 1 tablet (4 mg total)  by mouth every 8 (eight) hours as needed for nausea or vomiting. 05/14/17   Mesner, Barbara CowerJason, MD    Family History Family History  Problem Relation Age of Onset  . Diabetes Father   . Hyperlipidemia Father   . Diabetes Brother   . Diabetes Maternal Grandmother   . Hyperlipidemia Maternal Grandmother   . Stroke Maternal Grandmother   . Heart disease Paternal Grandmother   . Hyperlipidemia Paternal Grandmother   . Stroke Paternal Grandmother   . Cancer Paternal Grandfather     Social History Social History   Tobacco Use  . Smoking status: Passive Smoke Exposure - Never Smoker  . Smokeless tobacco: Never Used  Substance Use Topics  . Alcohol use: No    Alcohol/week: 0.0 standard drinks  . Drug use: No     Allergies   Penicillins   Review of Systems Review of Systems  Constitutional: Negative.   HENT: Negative.   Respiratory: Negative.   Cardiovascular: Negative.   Gastrointestinal: Negative.   Genitourinary: Negative for decreased urine volume, difficulty urinating, dysuria, enuresis, flank pain, frequency, genital sores, hematuria, menstrual problem, pelvic pain, urgency, vaginal bleeding, vaginal discharge and vaginal pain.       Vaginal swelling  Musculoskeletal: Negative.   Skin: Negative.   Neurological: Negative.   All other systems reviewed and are negative.    Physical Exam Updated Vital Signs BP (!) 137/91 (BP Location: Left Arm)   Pulse Marland Kitchen(!)  105   Temp 98.9 F (37.2 C) (Oral)   Resp 20   Ht 5\' 4"  (1.626 m)   Wt 64.4 kg   LMP 01/15/2019 (Exact Date)   SpO2 99%   BMI 24.37 kg/m   Physical Exam Vitals signs and nursing note reviewed. Exam conducted with a chaperone present.  Constitutional:      General: She is not in acute distress.    Appearance: She is not ill-appearing, toxic-appearing or diaphoretic.  HENT:     Head: Normocephalic and atraumatic.     Jaw: There is normal jaw occlusion.     Right Ear: Tympanic membrane, ear canal and  external ear normal. There is no impacted cerumen. No hemotympanum. Tympanic membrane is not injected, scarred, perforated, erythematous, retracted or bulging.     Left Ear: Tympanic membrane, ear canal and external ear normal. There is no impacted cerumen. No hemotympanum. Tympanic membrane is not injected, scarred, perforated, erythematous, retracted or bulging.     Ears:     Comments: No Mastoid tenderness.    Nose: Nose normal.     Comments: Clear rhinorrhea and congestion to bilateral nares.  No sinus tenderness.    Mouth/Throat:     Mouth: Mucous membranes are moist.     Pharynx: Oropharynx is clear.     Comments: Posterior oropharynx clear.  Mucous membranes moist.  Tonsils without erythema or exudate.  Uvula midline without deviation.  No evidence of PTA or RPA.  No drooling, dysphasia or trismus.  Phonation normal. Neck:     Trachea: Trachea and phonation normal.     Meningeal: Brudzinski's sign and Kernig's sign absent.     Comments: No Neck stiffness or neck rigidity.  No meningismus.  No cervical lymphadenopathy. Cardiovascular:     Rate and Rhythm: Normal rate.     Pulses: Normal pulses.     Heart sounds: Normal heart sounds.     Comments: No murmurs rubs or gallops. Pulmonary:     Effort: Pulmonary effort is normal.     Breath sounds: Normal breath sounds.     Comments: Clear to auscultation bilaterally without wheeze, rhonchi or rales.  No accessory muscle usage.  Able speak in full sentences. Abdominal:     General: Bowel sounds are normal. There is no distension.     Tenderness: There is no abdominal tenderness. There is no guarding or rebound.     Comments: Soft, nontender without rebound or guarding.  No CVA tenderness.  Genitourinary:    General: Normal vulva.     Comments: Normal appearing external female genitalia without rashes or lesions, normal vaginal epithelium, No labial swelling, fluctuance or induration. Normal appearing cervix with white/ light yellow  discharge. No cervical petechiae. Cervical os is closed. There is no bleeding noted at the os.No Odor. Bimanual: No CMT, nontender.  No palpable adnexal masses or tenderness. Uterus midline and not fixed. Rectovaginal exam was deferred.  No cystocele or rectocele noted. No pelvic lymphadenopathy noted. Wet prep was obtained.  Cultures for gonorrhea and chlamydia collected. Exam performed with chaperone in room. Musculoskeletal:     Comments: Moves all 4 extremities without difficulty.  Lower extremities without edema, erythema or warmth.  Skin:    Comments: Brisk capillary refill.  No rashes or lesions.  Neurological:     Mental Status: She is alert.     Comments: Ambulatory in department without difficulty.  Cranial nerves II through XII grossly intact.  No facial droop.  No aphasia.  ED Treatments / Results  Labs (all labs ordered are listed, but only abnormal results are displayed) Labs Reviewed  WET PREP, GENITAL - Abnormal; Notable for the following components:      Result Value   Clue Cells Wet Prep HPF POC PRESENT (*)    WBC, Wet Prep HPF POC MODERATE (*)    All other components within normal limits  URINALYSIS, ROUTINE W REFLEX MICROSCOPIC - Abnormal; Notable for the following components:   APPearance CLOUDY (*)    Hgb urine dipstick MODERATE (*)    Bilirubin Urine SMALL (*)    Ketones, ur 40 (*)    Protein, ur 30 (*)    All other components within normal limits  URINALYSIS, MICROSCOPIC (REFLEX) - Abnormal; Notable for the following components:   Bacteria, UA RARE (*)    All other components within normal limits  PREGNANCY, URINE  RPR  HIV ANTIBODY (ROUTINE TESTING W REFLEX)  GC[facility]CHLAM[facility]DIA PROBE AMP ([place]    EKG None  Radiology No results found.  Procedures Procedures (including critical care time)  Medications Ordered in ED Medications - No data to display  Initial Impression / Assessment and Plan / ED Course  I have reviewed the triage  vital signs and the nursing notes.  Pertinent labs & imaging results that were available during my care of the patient were reviewed by me and considered in my medical decision making (see chart for details).  23 year old appears otherwise well presents for evaluation of vaginal swelling. States "it feels like my insides are swollen". Occurs after intercourse. She is afebrile, nonseptic, non-ill-appearing.  She is sexually active and does not use protection.  Has also had white vaginal discharge.  Abdomen soft, nontender without rebound or guarding.  GU exam with white/yellow vaginal discharge in vault.  No evidence of labial or groin swelling.  No evidence of abscess or cellulitis.  No evidence of Bartholin cyst.  No evidence of little hernias.  No fluctuance or induration.  No evidence of skin infection on exam.  Ambulatory that difficulty.  Heart and lungs clear.  Tolerating p.o. intake at home.  No urinary symptoms.  Do not see any evidence of vaginal or pelvic swelling.  Urinalysis negative for infection.  Wet prep with BV and WBC.  GC/chlamydia, HIV and RPR pending.  Patient does not want empiric antibiotics for GC, chlamydia.  Pt understands that they have GC/Chlamydia cultures pending and that they will need to inform all sexual partners if results return positive. Pt not concerning for PID because hemodynamically stable and no cervical motion tenderness on pelvic exam. Pt has also been treated with Flagyl for Bacterial Vaginosis. Pt has been advised to not drink alcohol while on this medication.  Patient to be discharged with instructions to follow up with OBGYN/PCP. Discussed importance of using protection when sexually active.   The patient has been appropriately medically screened and/or stabilized in the ED. I have low suspicion for any other emergent medical condition which would require further screening, evaluation or treatment in the ED or require inpatient management.  Patient is  hemodynamically stable and in no acute distress.  Patient able to ambulate in department prior to ED.  Evaluation does not show acute pathology that would require ongoing or additional emergent interventions while in the emergency department or further inpatient treatment.  I have discussed the diagnosis with the patient and answered all questions.  Pain is been managed while in the emergency department and patient has  no further complaints prior to discharge.  Patient is comfortable with plan discussed in room and is stable for discharge at this time.  I have discussed strict return precautions for returning to the emergency department.  Patient was encouraged to follow-up with PCP/specialist refer to at discharge.     Final Clinical Impressions(s) / ED Diagnoses   Final diagnoses:  Bacterial vaginosis  Concern about STD in female without diagnosis    ED Discharge Orders         Ordered    metroNIDAZOLE (FLAGYL) 500 MG tablet  2 times daily     02/05/19 1526           Can Lucci A, PA-C 02/05/19 1528    Linwood DibblesKnapp, Jon, MD 02/06/19 [phone]

## 2019-02-06 LAB — HIV ANTIBODY (ROUTINE TESTING W REFLEX): HIV Screen 4th Generation wRfx: NONREACTIVE

## 2019-02-07 LAB — RPR: RPR Ser Ql: NONREACTIVE

## 2019-02-08 LAB — GC/CHLAMYDIA PROBE AMP (~~LOC~~) NOT AT ARMC
Chlamydia: POSITIVE — AB
Neisseria Gonorrhea: NEGATIVE

## 2019-02-09 ENCOUNTER — Telehealth: Payer: Self-pay | Admitting: Student

## 2019-02-09 DIAGNOSIS — A749 Chlamydial infection, unspecified: Secondary | ICD-10-CM

## 2019-02-09 MED ORDER — AZITHROMYCIN 500 MG PO TABS: 1000.0000 mg | ORAL_TABLET | Freq: Once | ORAL | 0 refills | Status: AC

## 2019-02-09 NOTE — Telephone Encounter (Addendum)
Rebecca Curtis tested positive for  Chlamydia. Patient was called by RN and allergies and pharmacy confirmed. Rx sent to pharmacy of choice.   Jorje Guild, NP 02/09/2019 10:10 AM       ----- Message from Bjorn Loser, RN sent at 02/08/2019  4:21 PM EDT ----- This patient tested positive for :  chlamydia  She "is allergic to Penicillin",  I have informed the patient of her results and confirmed her pharmacy is correct in her chart. Please send Rx.   Thank you,   Bjorn Loser, RN   Results faxed to [facility] Department.

## 2019-02-23 IMAGING — CT CT ABD-PELV W/ CM
2 of 4 series · 17 of 46 positions shown, 19 images · IV contrast (iopamidol)
Comparison: None.

CLINICAL DATA: Right upper quadrant pain for 5 days. Nausea for 3
days. Constipation for 2 days.

EXAM:
CT ABDOMEN AND PELVIS WITH CONTRAST
TECHNIQUE: Multidetector CT imaging of the abdomen and pelvis was performed
using the standard protocol following bolus administration of
intravenous contrast.
CONTRAST:  100mL W47HGI-1MM IOPAMIDOL (W47HGI-1MM) INJECTION 61%

[Series 3: a/p w/ 5mm · axial · 0.74mm/px · z∈[+1027,+1467]mm · 14 of 96 slices shown, 16 images]
[im 4/96  soft-tissue]
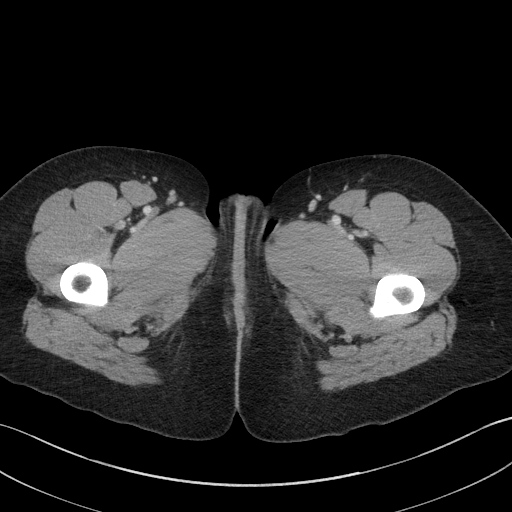
[im 4/96  bone]
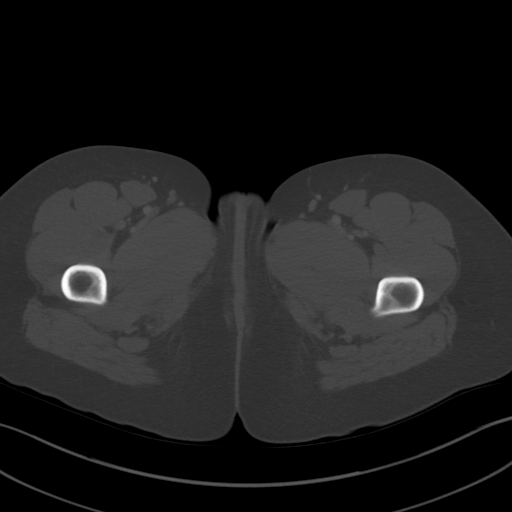
[im 12/96  soft-tissue]
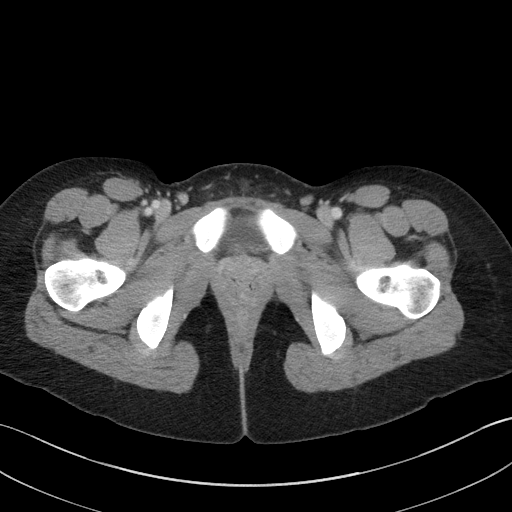
[im 20/96  soft-tissue]
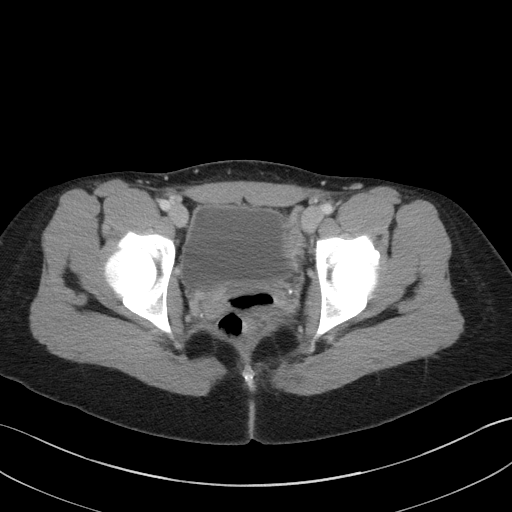
[im 24/96  soft-tissue]
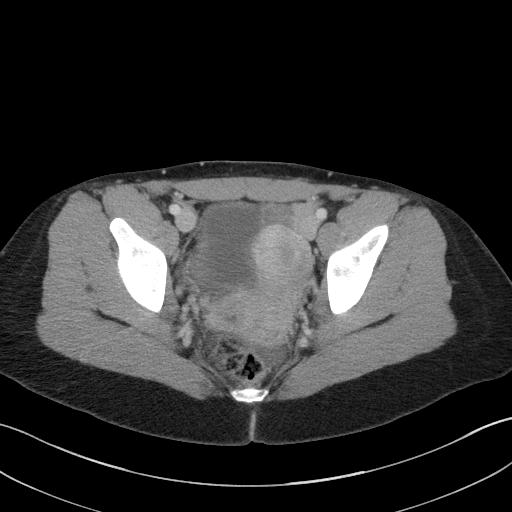
[im 32/96  soft-tissue]
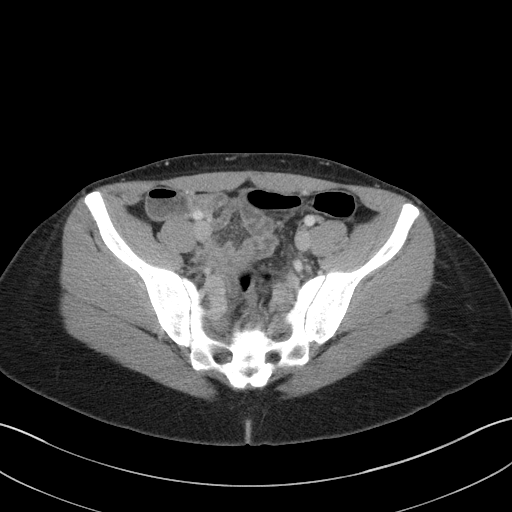
[im 40/96  soft-tissue]
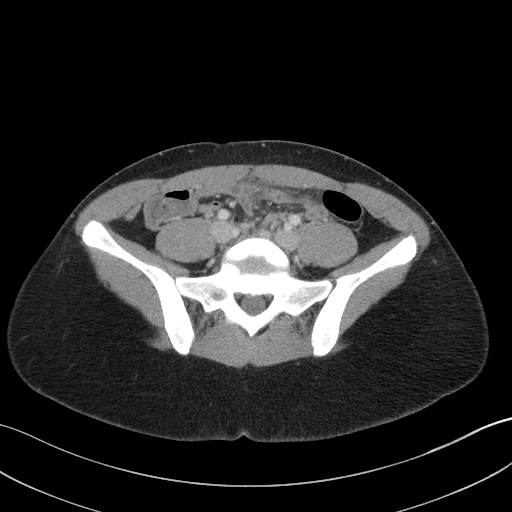
[im 44/96  soft-tissue]
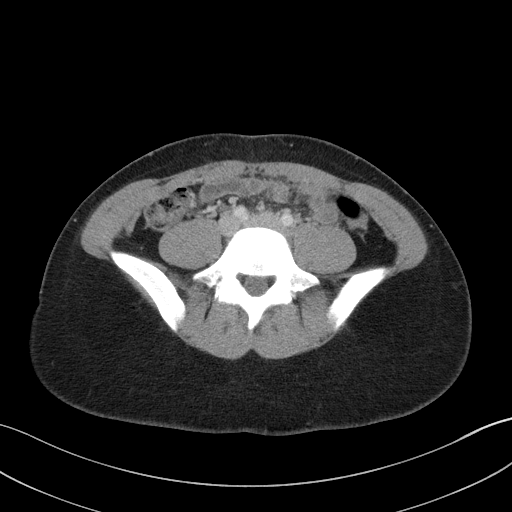
[im 52/96  soft-tissue]
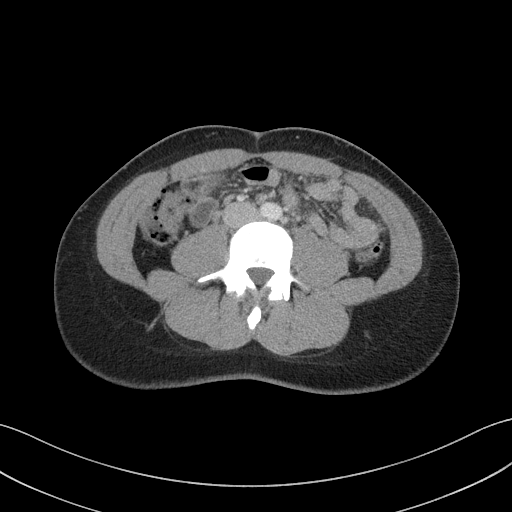
[im 56/96  soft-tissue]
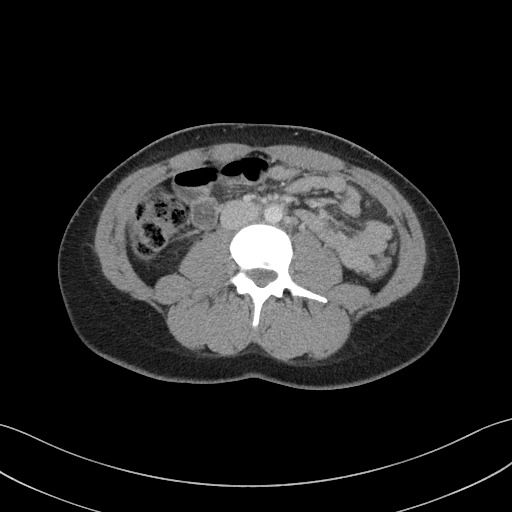
[im 56/96  bone]
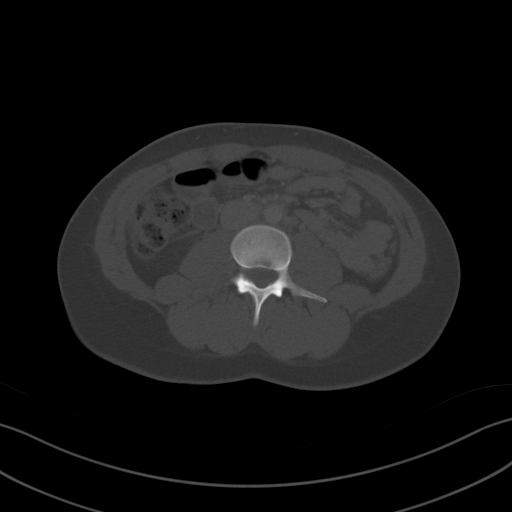
[im 64/96  soft-tissue]
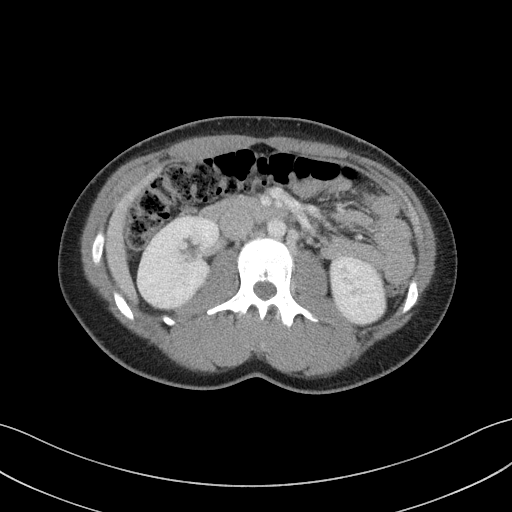
[im 72/96  soft-tissue]
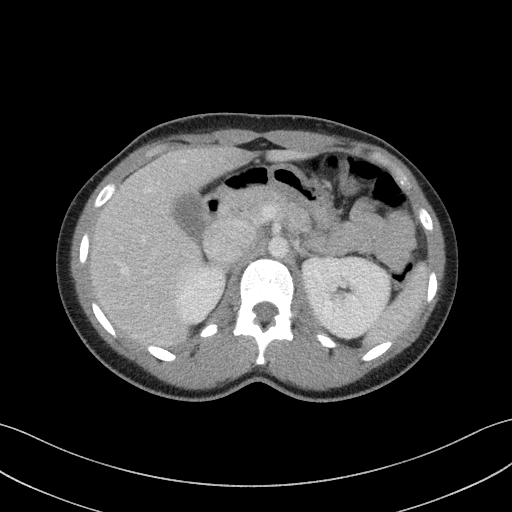
[im 76/96  soft-tissue]
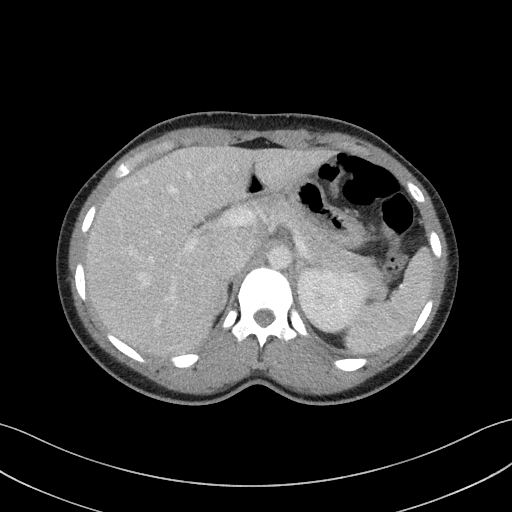
[im 84/96  soft-tissue]
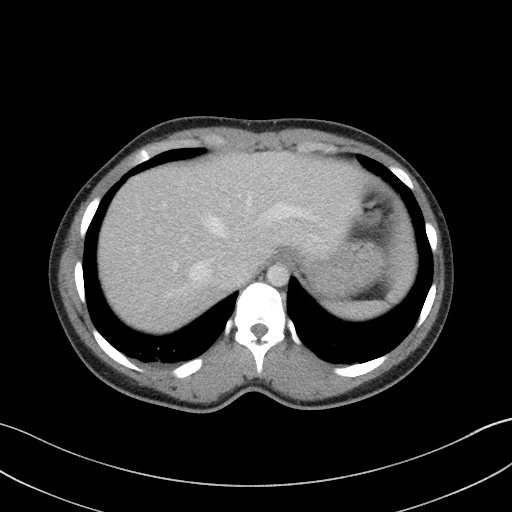
[im 92/96  soft-tissue]
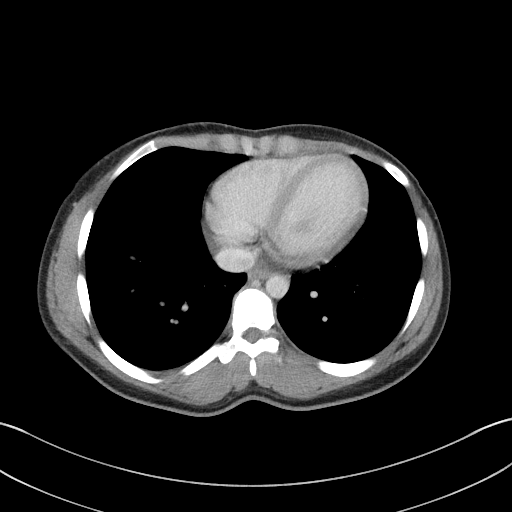

[Series 6: a/p w/ cor · coronal · 0.93mm/px · 3 of 113 slices shown]
[im 38/113  soft-tissue]
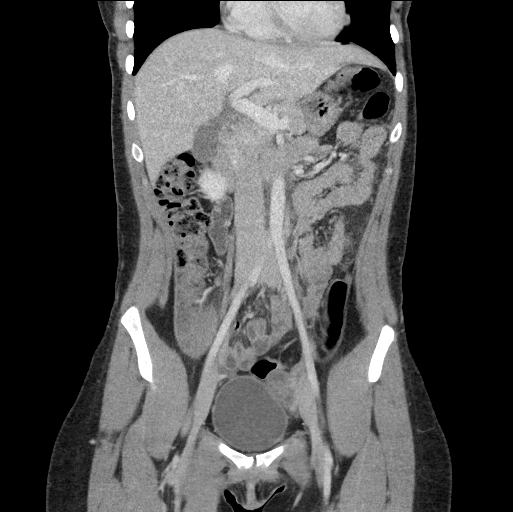
[im 50/113  soft-tissue]
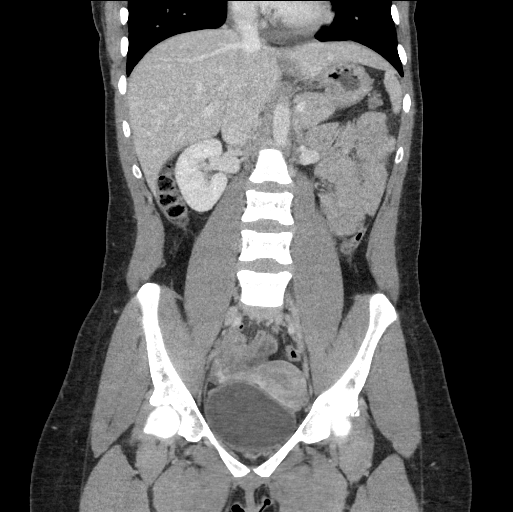
[im 63/113  soft-tissue]
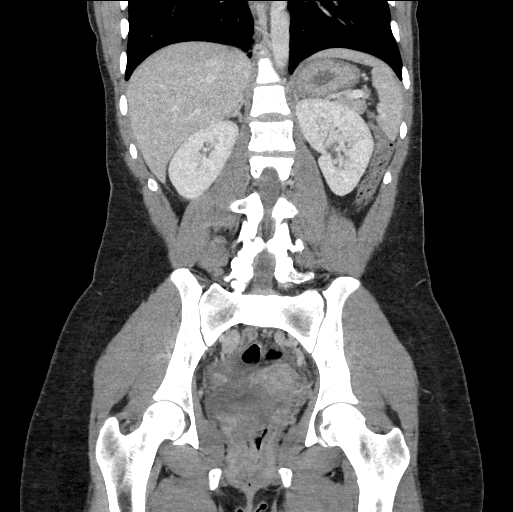

[17 of 46 positions shown; findings below may reference images not displayed]

FINDINGS: Lower chest: No acute abnormality.

Hepatobiliary: No focal liver abnormality is seen. No gallstones,
gallbladder wall thickening, or biliary dilatation.

Pancreas: Unremarkable. No pancreatic ductal dilatation or
surrounding inflammatory changes.

Spleen: Normal in size without focal abnormality.

Adrenals/Urinary Tract: Adrenal glands are unremarkable. Kidneys are
normal, without renal calculi, focal lesion, or hydronephrosis.
Bladder is unremarkable.

Stomach/Bowel: Stomach is within normal limits. Appendix appears
normal. No evidence of bowel wall thickening, distention, or
inflammatory changes. Moderate amount of stool in the ascending and
transverse colon.

Vascular/Lymphatic: No significant vascular findings are present. No
enlarged abdominal or pelvic lymph nodes.

Reproductive: Uterus and bilateral adnexa are unremarkable.

Other: Small amount of pelvic free fluid likely physiologic. No
abdominal wall hernia. No fluid collection or hematoma.

Musculoskeletal: No acute osseous abnormality. No lytic or sclerotic
osseous lesion.
IMPRESSION: 1. No acute abdominal or pelvic pathology.
2. Small amount of pelvic free fluid likely physiologic.
3. Moderate amount of stool in the ascending and transverse colon.

## 2019-05-12 ENCOUNTER — Inpatient Hospital Stay (HOSPITAL_COMMUNITY)
Admission: AD | Admit: 2019-05-12 | Discharge: 2019-05-13 | Disposition: A | Discharge status: Home / Self Care | Payer: Medicaid Other | Attending: Obstetrics & Gynecology | Admitting: Obstetrics & Gynecology

## 2019-05-12 ENCOUNTER — Encounter (HOSPITAL_COMMUNITY): Payer: Self-pay | Admitting: *Deleted

## 2019-05-12 ENCOUNTER — Other Ambulatory Visit: Payer: Self-pay

## 2019-05-12 ENCOUNTER — Inpatient Hospital Stay (HOSPITAL_COMMUNITY): Payer: Medicaid Other

## 2019-05-12 DIAGNOSIS — Z79899 Other long term (current) drug therapy: Secondary | ICD-10-CM | POA: Diagnosis not present

## 2019-05-12 DIAGNOSIS — Z7722 Contact with and (suspected) exposure to environmental tobacco smoke (acute) (chronic): Secondary | ICD-10-CM | POA: Diagnosis not present

## 2019-05-12 DIAGNOSIS — O26891 Other specified pregnancy related conditions, first trimester: Secondary | ICD-10-CM | POA: Diagnosis not present

## 2019-05-12 DIAGNOSIS — O209 Hemorrhage in early pregnancy, unspecified: Secondary | ICD-10-CM

## 2019-05-12 DIAGNOSIS — Z3A01 Less than 8 weeks gestation of pregnancy: Secondary | ICD-10-CM | POA: Insufficient documentation

## 2019-05-12 DIAGNOSIS — Z880 Allergy status to penicillin: Secondary | ICD-10-CM | POA: Diagnosis not present

## 2019-05-12 DIAGNOSIS — O3680X0 Pregnancy with inconclusive fetal viability, not applicable or unspecified: Secondary | ICD-10-CM | POA: Diagnosis not present

## 2019-05-12 DIAGNOSIS — Z6791 Unspecified blood type, Rh negative: Secondary | ICD-10-CM | POA: Diagnosis not present

## 2019-05-12 DIAGNOSIS — Z792 Long term (current) use of antibiotics: Secondary | ICD-10-CM | POA: Insufficient documentation

## 2019-05-12 HISTORY — DX: Other specified health status: Z78.9

## 2019-05-12 LAB — URINALYSIS, ROUTINE W REFLEX MICROSCOPIC
Bilirubin Urine: NEGATIVE
Glucose, UA: NEGATIVE mg/dL
Ketones, ur: 5 mg/dL — AB
Leukocytes,Ua: NEGATIVE
Nitrite: NEGATIVE
Protein, ur: NEGATIVE mg/dL
Specific Gravity, Urine: 1.011 (ref 1.005–1.030)
pH: 6 (ref 5.0–8.0)

## 2019-05-12 LAB — WET PREP, GENITAL
Sperm: NONE SEEN
Trich, Wet Prep: NONE SEEN
Yeast Wet Prep HPF POC: NONE SEEN

## 2019-05-12 LAB — POCT PREGNANCY, URINE: Preg Test, Ur: POSITIVE — AB

## 2019-05-12 LAB — CBC
HCT: 31.8 % — ABNORMAL LOW (ref 36.0–46.0)
Hemoglobin: 9.9 g/dL — ABNORMAL LOW (ref 12.0–15.0)
MCH: 25.5 pg — ABNORMAL LOW (ref 26.0–34.0)
MCHC: 31.1 g/dL (ref 30.0–36.0)
MCV: 82 fL (ref 80.0–100.0)
Platelets: 311 10*3/uL (ref 150–400)
RBC: 3.88 MIL/uL (ref 3.87–5.11)
RDW: 16.9 % — ABNORMAL HIGH (ref 11.5–15.5)
WBC: 9.7 10*3/uL (ref 4.0–10.5)
nRBC: 0 % (ref 0.0–0.2)

## 2019-05-12 LAB — HCG, QUANTITATIVE, PREGNANCY: hCG, Beta Chain, Quant, S: 66 m[IU]/mL — ABNORMAL HIGH (ref <5)

## 2019-05-12 MED ORDER — RHO D IMMUNE GLOBULIN 1500 UNIT/2ML IJ SOSY
300.0000 ug | PREFILLED_SYRINGE | Freq: Once | INTRAMUSCULAR | Status: AC
  Administered 2019-05-12: 300 ug via INTRAMUSCULAR
  Filled 2019-05-12: qty 2

## 2019-05-12 NOTE — Progress Notes (Signed)
Written and verbal d/c instructions given and understanding voiced. Pt crying for fear of potentially loosing the pregnancy. Pt's sister with her and supportive. Emotional support given

## 2019-05-12 NOTE — MAU Note (Signed)
Hansel Feinstein CNM in to discuss test results and plan of care. RN explained reason for Rhophylac before given and pt voiced understanding.

## 2019-05-12 NOTE — MAU Note (Addendum)
Had positive pregnancy test today. Spotting some bright red blood. Last month had a positive upt and then a negative upt. No pain. Used 2 pads today that were not saturated

## 2019-05-12 NOTE — Discharge Instructions (Signed)
Vaginal Bleeding During Pregnancy, First Trimester ° °A small amount of bleeding (spotting) from the vagina is common during early pregnancy. Sometimes the bleeding is normal and does not cause problems. At other times, though, bleeding may be a sign of something serious. Tell your doctor about any bleeding from your vagina right away. °Follow these instructions at home: °Activity °· Follow your doctor's instructions about how active you can be. °· If needed, make plans for someone to help with your normal activities. °· Do not have sex or orgasms until your doctor says that this is safe. °General instructions °· Take over-the-counter and prescription medicines only as told by your doctor. °· Watch your condition for any changes. °· Write down: °? The number of pads you use each day. °? How often you change pads. °? How soaked (saturated) your pads are. °· Do not use tampons. °· Do not douche. °· If you pass any tissue from your vagina, save it to show to your doctor. °· Keep all follow-up visits as told by your doctor. This is important. °Contact a doctor if: °· You have vaginal bleeding at any time while you are pregnant. °· You have cramps. °· You have a fever. °Get help right away if: °· You have very bad cramps in your back or belly (abdomen). °· You pass large clots or a lot of tissue from your vagina. °· Your bleeding gets worse. °· You feel light-headed. °· You feel weak. °· You pass out (faint). °· You have chills. °· You are leaking fluid from your vagina. °· You have a gush of fluid from your vagina. °Summary °· Sometimes vaginal bleeding during pregnancy is normal and does not cause problems. At other times, bleeding may be a sign of something serious. °· Tell your doctor about any bleeding from your vagina right away. °· Follow your doctor's instructions about how active you can be. You may need someone to help you with your normal activities. °This information is not intended to replace advice given to  you by your health care provider. Make sure you discuss any questions you have with your health care provider. °Document Released: 11/14/2013 Document Revised: 10/19/2018 Document Reviewed: 10/01/2016 °Elsevier Patient Education © 2020 [employer] ° °

## 2019-05-12 NOTE — MAU Provider Note (Signed)
Chief Complaint: Vaginal Bleeding   First Provider Initiated Contact with Patient 05/12/19 2007        SUBJECTIVE HPI: Rebecca Curtis is a 23 y.o. G1P0 at [redacted]w[redacted]d by LMP who presents to maternity admissions reporting vaginal bleeding for 1-2 days.  Had a positive test about 3-4 weeks ago, followed by a negative test.  Did not have sex between that test and today.  States last intercourse was about 3-4 weeks ago.   States had postitive UPTs yesterday and today.  No pain.     She denies vaginal itching/burning, urinary symptoms, h/a, dizziness, n/v, or fever/chills.    RN Note: Had positive pregnancy test today. Spotting some bright red blood. Last month had a positive upt and then a negative upt. No pain. Used 2 pads today that were not saturated   Past Medical History:  Diagnosis Date  . Allergy   . Medical history non-contributory    Past Surgical History:  Procedure Laterality Date  . HERNIA REPAIR     Social History   Socioeconomic History  . Marital status: Single    Spouse name: Not on file  . Number of children: Not on file  . Years of education: Not on file  . Highest education level: Not on file  Occupational History  . Not on file  Social Needs  . Financial resource strain: Not on file  . Food insecurity    Worry: Not on file    Inability: Not on file  . Transportation needs    Medical: Not on file    Non-medical: Not on file  Tobacco Use  . Smoking status: Passive Smoke Exposure - Never Smoker  . Smokeless tobacco: Never Used  Substance and Sexual Activity  . Alcohol use: No    Alcohol/week: 0.0 standard drinks  . Drug use: No  . Sexual activity: Yes    Birth control/protection: None  Lifestyle  . Physical activity    Days per week: Not on file    Minutes per session: Not on file  . Stress: Not on file  Relationships  . Social Herbalist on phone: Not on file    Gets together: Not on file    Attends religious service: Not on file    Active  member of club or organization: Not on file    Attends meetings of clubs or organizations: Not on file    Relationship status: Not on file  . Intimate partner violence    Fear of current or ex partner: Not on file    Emotionally abused: Not on file    Physically abused: Not on file    Forced sexual activity: Not on file  Other Topics Concern  . Not on file  Social History Narrative  . Not on file   No current facility-administered medications on file prior to encounter.    Current Outpatient Medications on File Prior to Encounter  Medication Sig Dispense Refill  . ciprofloxacin (CIPRO) 500 MG tablet Take 1 tablet (500 mg total) by mouth 2 (two) times daily. 14 tablet 0  . HYDROcodone-acetaminophen (NORCO/VICODIN) 5-325 MG tablet Take 1 tablet by mouth every 6 (six) hours as needed for moderate pain. 5 tablet 0  . ibuprofen (ADVIL,MOTRIN) 200 MG tablet Take 800 mg by mouth every 6 (six) hours as needed for moderate pain.    . metroNIDAZOLE (FLAGYL) 500 MG tablet Take 1 tablet (500 mg total) by mouth 2 (two) times daily. 14 tablet 0  .  ondansetron (ZOFRAN) 4 MG tablet Take 1 tablet (4 mg total) by mouth every 8 (eight) hours as needed for nausea or vomiting. 12 tablet 0   Allergies  Allergen Reactions  . Penicillins Anaphylaxis    Heart stopped    Family History negative for pregnancy complications Father has Diabetes and Hyperlipidemia Brother has diabetes MGM has diabetes, hyperlipidemia and stroke  I have reviewed patient's Past Medical Hx, Surgical Hx, Family Hx, Social Hx, medications and allergies.   ROS:  Review of Systems  Constitutional: Negative for chills and fever.  Respiratory: Negative for shortness of breath.   Gastrointestinal: Negative for abdominal pain, constipation, diarrhea and nausea.  Genitourinary: Positive for vaginal bleeding. Negative for dysuria and pelvic pain.  Musculoskeletal: Negative for back pain.    Other systems negative   Physical Exam   Physical Exam Patient Vitals for the past 24 hrs:  BP Temp Pulse Resp Height Weight  05/12/19 1918 130/65 98.1 F (36.7 C) 67 18 5\' 4"  (1.626 m) 62.6 kg   Constitutional: Well-developed, well-nourished female in no acute distress.  Cardiovascular: normal rate Respiratory: normal effort GI: Abd soft, non-tender. Pos BS x 4 MS: Extremities nontender, no edema, normal ROM Neurologic: Alert and oriented x 4.  GU: Neg CVAT.  PELVIC EXAM: Cervix pink, visually closed, without lesion, scant red discharge, vaginal walls and external genitalia normal Bimanual exam: Cervix 0/long/high, firm, anterior, neg CMT, uterus nontender, not very enlarged, adnexa without tenderness, enlargement, or mass   LAB RESULTS Results for orders placed or performed during the hospital encounter of 05/12/19 (from the past 24 hour(s))  Urinalysis, Routine w reflex microscopic     Status: Abnormal   Collection Time: 05/12/19  7:22 PM  Result Value Ref Range   Color, Urine YELLOW YELLOW   APPearance CLEAR CLEAR   Specific Gravity, Urine 1.011 1.005 - 1.030   pH 6.0 5.0 - 8.0   Glucose, UA NEGATIVE NEGATIVE mg/dL   Hgb urine dipstick LARGE (A) NEGATIVE   Bilirubin Urine NEGATIVE NEGATIVE   Ketones, ur 5 (A) NEGATIVE mg/dL   Protein, ur NEGATIVE NEGATIVE mg/dL   Nitrite NEGATIVE NEGATIVE   Leukocytes,Ua NEGATIVE NEGATIVE   RBC / HPF 0-5 0 - 5 RBC/hpf   WBC, UA 0-5 0 - 5 WBC/hpf   Bacteria, UA RARE (A) NONE SEEN   Squamous Epithelial / LPF 0-5 0 - 5   Mucus PRESENT   Pregnancy, urine POC     Status: Abnormal   Collection Time: 05/12/19  7:32 PM  Result Value Ref Range   Preg Test, Ur POSITIVE (A) NEGATIVE  Wet prep, genital     Status: Abnormal   Collection Time: 05/12/19  8:20 PM   Specimen: PATH Cytology Cervicovaginal Ancillary Only  Result Value Ref Range   Yeast Wet Prep HPF POC NONE SEEN NONE SEEN   Trich, Wet Prep NONE SEEN NONE SEEN   Clue Cells Wet Prep HPF POC PRESENT (A) NONE SEEN   WBC,  Wet Prep HPF POC MANY (A) NONE SEEN   Sperm NONE SEEN   hCG, quantitative, pregnancy     Status: Abnormal   Collection Time: 05/12/19  8:37 PM  Result Value Ref Range   hCG, Beta Chain, Quant, S 66 (H) <5 mIU/mL  CBC     Status: Abnormal   Collection Time: 05/12/19  8:37 PM  Result Value Ref Range   WBC 9.7 4.0 - 10.5 K/uL   RBC 3.88 3.87 - 5.11 MIL/uL  Hemoglobin 9.9 (L) 12.0 - 15.0 g/dL   HCT 11.9 (L) 14.7 - 82.9 %   MCV 82.0 80.0 - 100.0 fL   MCH 25.5 (L) 26.0 - 34.0 pg   MCHC 31.1 30.0 - 36.0 g/dL   RDW 56.2 (H) 13.0 - 86.5 %   Platelets 311 150 - 400 K/uL   nRBC 0.0 0.0 - 0.2 %  ABO/Rh     Status: None   Collection Time: 05/12/19  8:37 PM  Result Value Ref Range   ABO/RH(D)      O NEG Performed at [facility], [address] [place], [place] [address]   Rh IG workup (includes ABO/Rh)     Status: None (Preliminary result)   Collection Time: 05/12/19  8:37 PM  Result Value Ref Range   Gestational Age(Wks) 4    ABO/RH(D) O NEG    Antibody Screen NEG    Unit Number [id number]    Blood Component Type RHIG    Unit division 00    Status of Unit ISSUED    Transfusion Status      OK TO TRANSFUSE Performed at [facility], [address], [place], [place] [address]     IMAGING US Ob Less Than 14 Weeks With Ob Transvaginal  Result Date: 05/12/2019 CLINICAL DATA:  23 year old pregnant female with vaginal bleeding. LMP: 04/10/2019 corresponding to an estimated gestational age of [redacted] weeks, 4 days. EXAM: OBSTETRIC <14 WK [place] AND TRANSVAGINAL OB US TECHNIQUE: Both transabdominal and transvaginal ultrasound examinations were performed for complete evaluation of the gestation as well as the maternal uterus, adnexal regions, and pelvic cul-de-sac. Transvaginal technique was performed to assess early pregnancy. COMPARISON:  None. FINDINGS: The uterus is anteverted and appears unremarkable. The endometrium measures approximately 10 mm in thickness and appears  unremarkable. No intrauterine pregnancy identified. The ovaries are unremarkable. There is a 1.9 cm corpus luteum in the left ovary. Small to moderate amount of free fluid noted within the pelvis. IMPRESSION: No intrauterine pregnancy identified and no adnexal mass noted. Findings consistent with a pregnancy of unknown location and differential diagnosis includes: An early IUP, recent spontaneous abortion, or an occult ectopic pregnancy. Clinical correlation and follow-up with serial HCG levels and repeat ultrasound in 7 days, or earlier if clinically indicated, recommended. Electronically Signed   By: Elgie Collard M.D.   On: 05/12/2019 21:15     MAU Management/MDM: Ordered usual first trimester r/o ectopic labs.   Pelvic exam and cultures done Will check baseline Ultrasound to rule out ectopic.  This bleeding/pain can represent a normal pregnancy with bleeding, spontaneous abortion or even an ectopic which can be life-threatening.  The process as listed above helps to determine which of these is present.  Reviewed results with patient.  Cannot rule out ectopic pregnancy yet, so we need to repeat the HCG in 48 hours (Sat night or Sunday morning)    Rhophylac given due to her being O Neg   ASSESSMENT Pregnancy at [redacted]w[redacted]d by LMP Bleeding in the first trimester Pregnancy of unknown location Rh Negative in pregnancy   PLAN Discharge home Plan to repeat HCG level in 48 hours  Will repeat  Ultrasound in about 7-10 days if HCG levels double appropriately  Ectopic precautions   Pt stable at time of discharge. Encouraged to return here or to other Urgent Care/ED if she develops worsening of symptoms, increase in pain, fever, or other concerning symptoms.    Wynelle Bourgeois CNM, MSN Certified  Nurse-Midwife 05/12/2019  8:08 PM

## 2019-05-13 DIAGNOSIS — Z3A01 Less than 8 weeks gestation of pregnancy: Secondary | ICD-10-CM

## 2019-05-13 DIAGNOSIS — O360110 Maternal care for anti-D [Rh] antibodies, first trimester, not applicable or unspecified: Secondary | ICD-10-CM

## 2019-05-13 DIAGNOSIS — O209 Hemorrhage in early pregnancy, unspecified: Secondary | ICD-10-CM

## 2019-05-13 DIAGNOSIS — O3680X0 Pregnancy with inconclusive fetal viability, not applicable or unspecified: Secondary | ICD-10-CM

## 2019-05-13 LAB — GC/CHLAMYDIA PROBE AMP (~~LOC~~) NOT AT ARMC
Chlamydia: NEGATIVE
Comment: NEGATIVE
Comment: NORMAL
Neisseria Gonorrhea: NEGATIVE

## 2019-05-13 LAB — RH IG WORKUP (INCLUDES ABO/RH)
ABO/RH(D): O NEG
Antibody Screen: NEGATIVE
Gestational Age(Wks): 4
Unit division: 0

## 2019-05-13 LAB — ABO/RH: ABO/RH(D): O NEG

## 2019-05-14 ENCOUNTER — Other Ambulatory Visit: Payer: Self-pay

## 2019-05-14 ENCOUNTER — Inpatient Hospital Stay (HOSPITAL_COMMUNITY)
Admission: AD | Admit: 2019-05-14 | Discharge: 2019-05-14 | Disposition: A | Discharge status: Home / Self Care | Payer: Medicaid Other | Attending: Obstetrics and Gynecology | Admitting: Obstetrics and Gynecology

## 2019-05-14 DIAGNOSIS — Z3A01 Less than 8 weeks gestation of pregnancy: Secondary | ICD-10-CM | POA: Insufficient documentation

## 2019-05-14 DIAGNOSIS — N939 Abnormal uterine and vaginal bleeding, unspecified: Secondary | ICD-10-CM | POA: Insufficient documentation

## 2019-05-14 DIAGNOSIS — O4691 Antepartum hemorrhage, unspecified, first trimester: Secondary | ICD-10-CM | POA: Diagnosis present

## 2019-05-14 LAB — HCG, QUANTITATIVE, PREGNANCY: hCG, Beta Chain, Quant, S: 19 m[IU]/mL — ABNORMAL HIGH (ref <5)

## 2019-05-14 NOTE — MAU Note (Signed)
Patient presents to MAU c/o repeat hcg labs. Patient reports some small vaginal bleeding, patient has no other complaints.

## 2019-05-14 NOTE — MAU Provider Note (Signed)
History   Chief Complaint:  Labs Only   Rebecca Curtis is  23 y.o. G1P0 Patient's last menstrual period was 04/10/2019.Marland Kitchen Patient is here for follow up of quantitative HCG and ongoing surveillance of pregnancy status.   She is [redacted]w[redacted]d weeks gestation  by LMP.    Since her last visit, the patient is without new complaint.   The patient reports bleeding as  spotting.  Bleeding has not stopped but is not worse than it was on the 29th.  Is not wearing a pad.  Notices bleeding when she wipes after voiding.  No abdominal pain.  Client is very anxious about whether this will be a miscarriage.  Note and labs from 05-12-19 reviewed.  Had Rhogam on 05-12-19.  General ROS:  No other complaints other than spotting after urination.  Her previous Quantitative HCG values are: 05-12-19  Quant of 66     Physical Exam   Blood pressure 133/77, pulse 90, temperature 98.5 F (36.9 C), temperature source Oral, resp. rate 18, height 5\' 4"  (1.626 m), weight 61.5 kg, last menstrual period 04/10/2019, SpO2 100 %.  GENERAL: Well-developed, well-nourished female in no acute distress.  HEENT: Normocephalic, atraumatic.  LUNGS: Effort normal  HEART: Regular rate  SKIN: Warm, dry and without erythema  PSYCH: Normal mood and affect  Labs: Results for orders placed or performed during the hospital encounter of 05/14/19 (from the past 24 hour(s))  hCG, quantitative, pregnancy   Collection Time: 05/14/19  7:48 PM  Result Value Ref Range   hCG, Beta Chain, Quant, S 19 (H) <5 mIU/mL    Ultrasound Studies:   05/16/19 Ob Less Than 14 Weeks With Ob Transvaginal  Result Date: 05/12/2019 CLINICAL DATA:  23 year old pregnant female with vaginal bleeding. LMP: 04/10/2019 corresponding to an estimated gestational age of [redacted] weeks, 4 days. EXAM: OBSTETRIC <14 WK 04/12/2019 AND TRANSVAGINAL OB US TECHNIQUE: Both transabdominal and transvaginal ultrasound examinations were performed for complete evaluation of the gestation as well as the  maternal uterus, adnexal regions, and pelvic cul-de-sac. Transvaginal technique was performed to assess early pregnancy. COMPARISON:  None. FINDINGS: The uterus is anteverted and appears unremarkable. The endometrium measures approximately 10 mm in thickness and appears unremarkable. No intrauterine pregnancy identified. The ovaries are unremarkable. There is a 1.9 cm corpus luteum in the left ovary. Small to moderate amount of free fluid noted within the pelvis. IMPRESSION: No intrauterine pregnancy identified and no adnexal mass noted. Findings consistent with a pregnancy of unknown location and differential diagnosis includes: An early IUP, recent spontaneous abortion, or an occult ectopic pregnancy. Clinical correlation and follow-up with serial HCG levels and repeat ultrasound in 7 days, or earlier if clinically indicated, recommended. Electronically Signed   By: [place] M.D.   On: 05/12/2019 21:15    Assessment:  [redacted]w[redacted]d weeks gestation   Pregnancy of undetermined anatomic location with falling quants and likely a miscarriage in progress   Plan: Client left after blood was drawn. Called client and verified 2 types of information.  Advised that her BHCG levels are decreasing and this continued vaginal bleeding could be a miscarriage.   Advised that she needs to follow up in the clinic for further lab work to make sure the level is returning to zero.  Client will check for appointment in MyChart. The patient is instructed to follow up Nov.9 at 9 am for repeat quant at [facility] office. Will also need appointment in one week after that lab draw to meet with a provider -  message sent to [facility] staff at [facility] to schedule for that appointment.  Valda Christenson L Therman Hughlett 05/14/2019, 8:53 PM

## 2019-05-23 ENCOUNTER — Ambulatory Visit: Payer: Medicaid Other

## 2019-05-30 ENCOUNTER — Ambulatory Visit: Payer: Medicaid Other | Admitting: Nurse Practitioner

## 2019-05-31 ENCOUNTER — Encounter: Payer: Self-pay | Admitting: Obstetrics and Gynecology

## 2019-09-16 ENCOUNTER — Ambulatory Visit (INDEPENDENT_AMBULATORY_CARE_PROVIDER_SITE_OTHER): Payer: Medicaid Other | Admitting: *Deleted

## 2019-09-16 ENCOUNTER — Other Ambulatory Visit: Payer: Self-pay

## 2019-09-16 DIAGNOSIS — Z3490 Encounter for supervision of normal pregnancy, unspecified, unspecified trimester: Secondary | ICD-10-CM | POA: Insufficient documentation

## 2019-09-16 DIAGNOSIS — Z3201 Encounter for pregnancy test, result positive: Secondary | ICD-10-CM | POA: Diagnosis present

## 2019-09-16 DIAGNOSIS — O219 Vomiting of pregnancy, unspecified: Secondary | ICD-10-CM

## 2019-09-16 MED ORDER — DOXYLAMINE-PYRIDOXINE 10-10 MG PO TBEC: 2.0000 | DELAYED_RELEASE_TABLET | Freq: Every day | ORAL | 0 refills | Status: AC

## 2019-09-16 MED ORDER — PREPLUS 27-1 MG PO TABS: 1.0000 | ORAL_TABLET | Freq: Every day | ORAL | 12 refills | Status: AC

## 2019-09-16 NOTE — Progress Notes (Signed)
Patient ID: Rebecca Curtis, female   DOB: [birth year], 24 y.o.   MRN: [id number] Patient seen and assessed by nursing staff during this encounter. I have reviewed the chart and agree with the documentation and plan.  Scheryl Darter, MD 09/16/2019 9:17 PM

## 2019-09-16 NOTE — Progress Notes (Signed)
Here for pregnancy test . LMP 07/31/19  Which makes her [redacted]w[redacted]d with EDD 05/06/20. Advised to start prenatal care and PNV. Will send in RX for PNV as she would like to start care here.  Also c/o nausea and vomiting.Would like RX. Discussed options and she chose [facility]. Rx sent in. Revonda Menter,RN

## 2019-10-24 ENCOUNTER — Ambulatory Visit (INDEPENDENT_AMBULATORY_CARE_PROVIDER_SITE_OTHER): Payer: Medicaid Other | Admitting: *Deleted

## 2019-10-24 DIAGNOSIS — Z3490 Encounter for supervision of normal pregnancy, unspecified, unspecified trimester: Secondary | ICD-10-CM

## 2019-10-24 DIAGNOSIS — O09899 Supervision of other high risk pregnancies, unspecified trimester: Secondary | ICD-10-CM | POA: Insufficient documentation

## 2019-10-24 NOTE — Progress Notes (Signed)
2:25 I called Madonna for her telephone visit for her New OB Intake. I was unable to leave a message. I  Heard a message " we are unable to complete your call at this time- please call back at another time". Bonita Quin, RN  I connected with  Benedict Needy on 10/24/19 at  2:30 PM EDT by telephone and verified that I am speaking with the correct person using two identifiers.   I discussed the limitations, risks, security and privacy concerns of performing an evaluation and management service by telephone and the availability of in person appointments. I also discussed with the patient that there may be a patient responsible charge related to this service. The patient expressed understanding and agreed to proceed.  Sruti informed she doesn't need this appointment- she has gotten another appointment with another prenatal provider in [place] and is starting care there. I informed her that is fine and we will cancel her appointment here. I wished her well with her pregnancy.  Raahil Ong,RN 10/24/2019  2:35 PM

## 2019-10-24 NOTE — Progress Notes (Signed)
Patient ID: Rebecca Curtis, female   DOB: 05/06/1996, 24 y.o.   MRN: [id number] Patient seen and assessed by nursing staff during this encounter. I have reviewed the chart and agree with the documentation and plan. I have also made any necessary editorial changes.  Scheryl Darter, MD 10/24/2019 2:52 PM

## 2019-10-31 ENCOUNTER — Encounter: Payer: Medicaid Other | Admitting: Obstetrics & Gynecology

## 2021-02-20 IMAGING — US US OB < 14 WEEKS - US OB TV
1 series · 15 of 28 positions shown · non-contrast
Comparison: None.

CLINICAL DATA: 23-year-old pregnant female with vaginal bleeding.
LMP: 04/10/2019 corresponding to an estimated gestational age of 4
weeks, 4 days.

EXAM:
OBSTETRIC <14 WK US AND TRANSVAGINAL OB US
TECHNIQUE: Both transabdominal and transvaginal ultrasound examinations were
performed for complete evaluation of the gestation as well as the
maternal uterus, adnexal regions, and pelvic cul-de-sac.
Transvaginal technique was performed to assess early pregnancy.

[Series 1: us ob < 14 weeks - us ob tv · 15 of 57 slices shown]
[im 1/57]
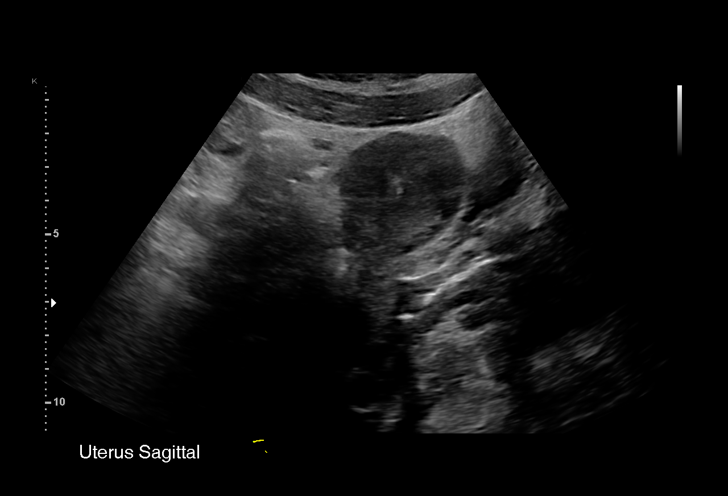
[im 5/57]
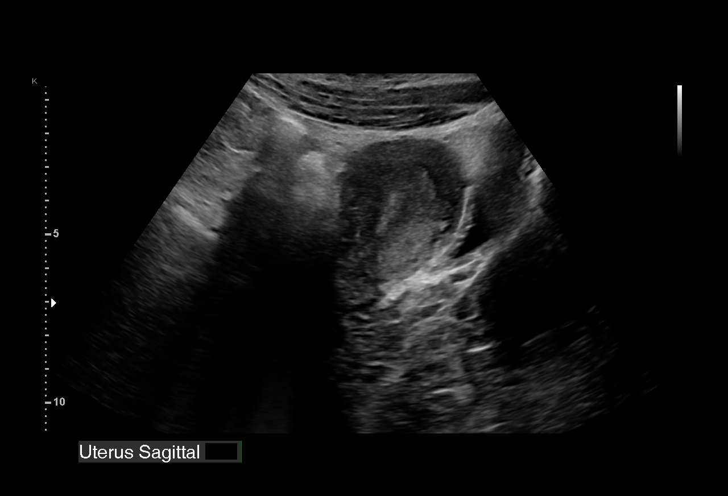
[im 9/57]
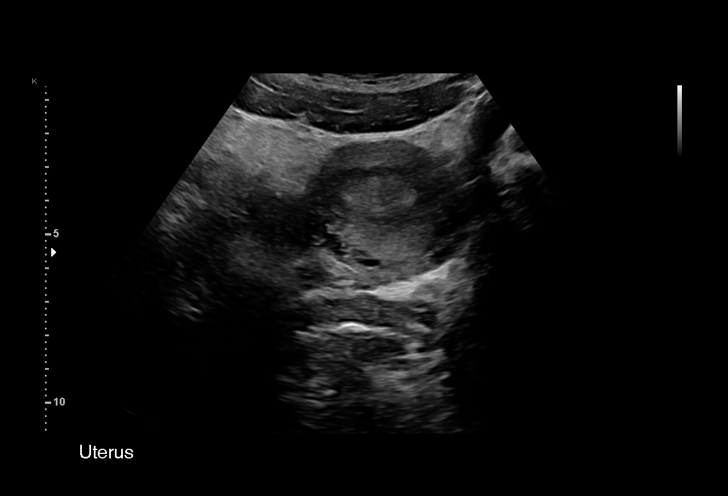
[im 13/57]
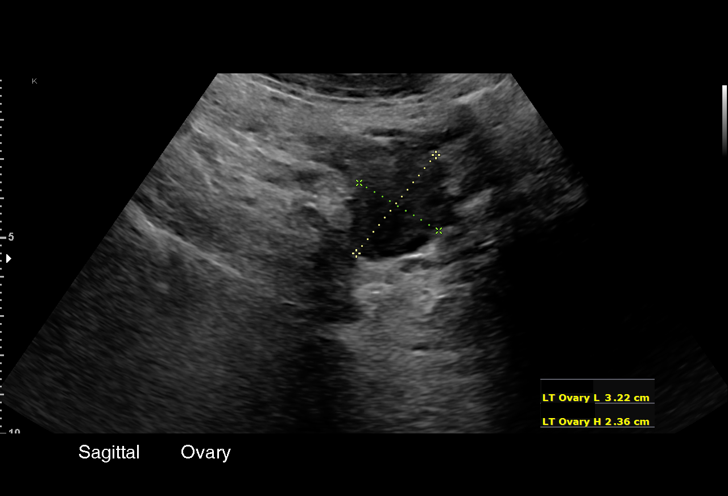
[im 17/57]
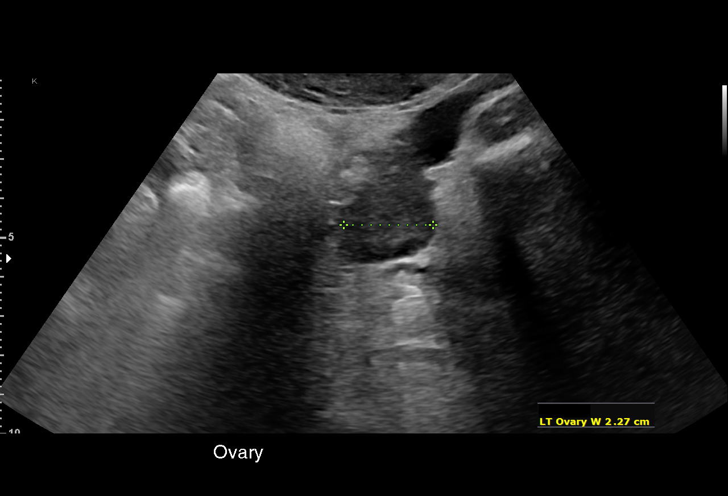
[im 21/57]
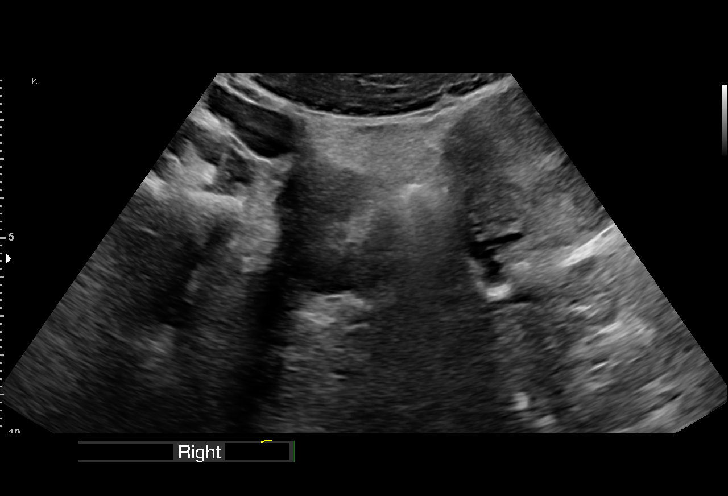
[im 25/57]
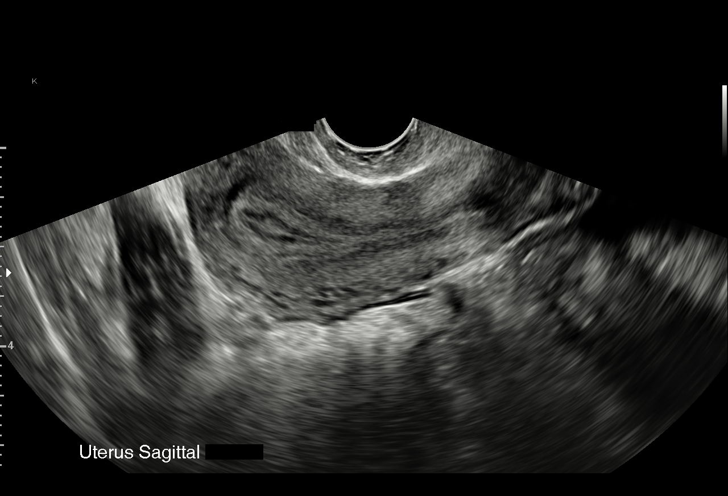
[im 30/57]
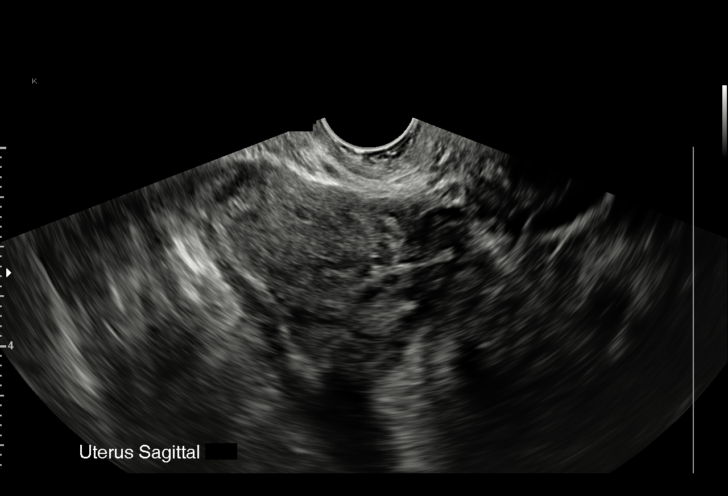
[im 32/57]
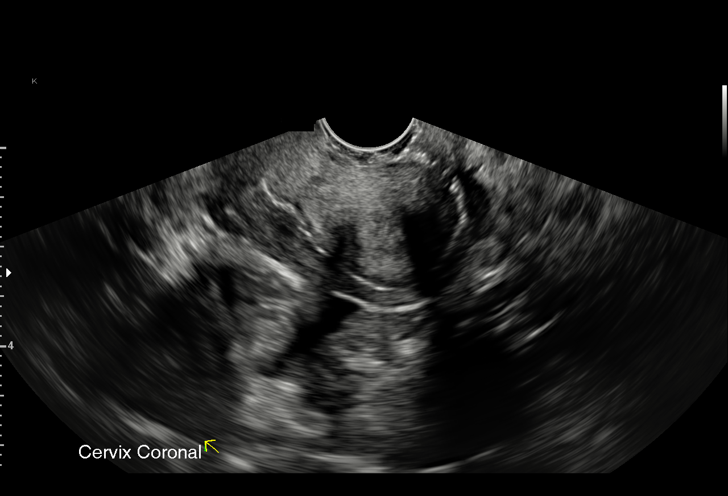
[im 36/57]
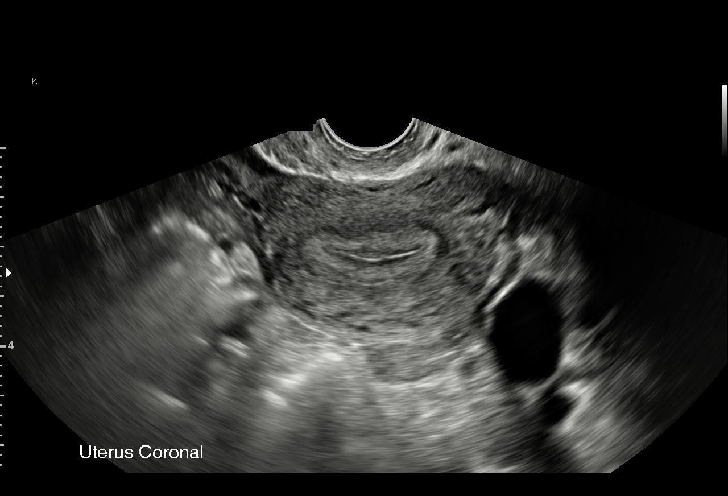
[im 40/57]
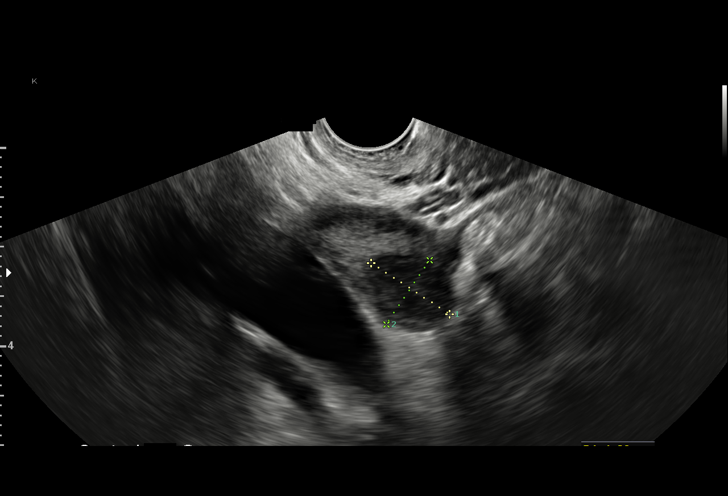
[im 44/57]
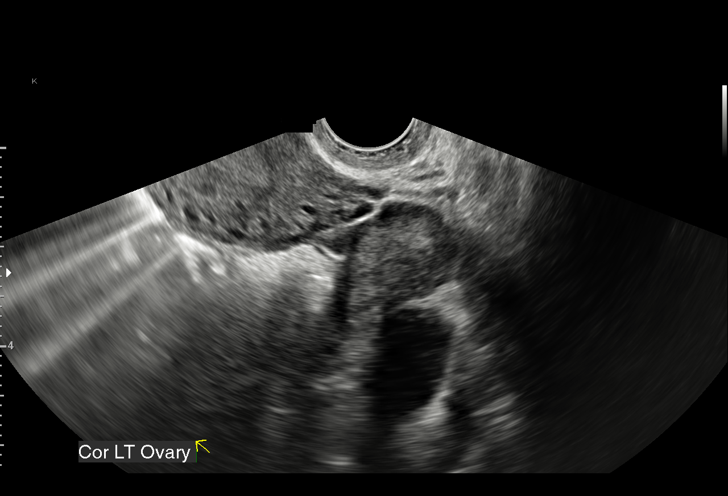
[im 48/57]
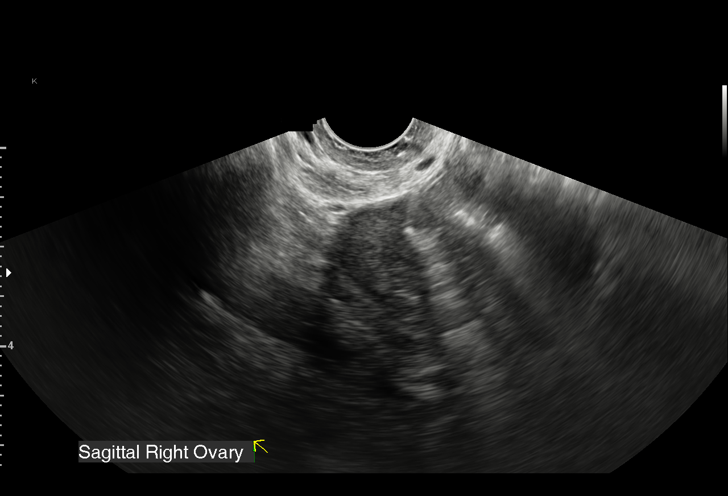
[im 52/57]
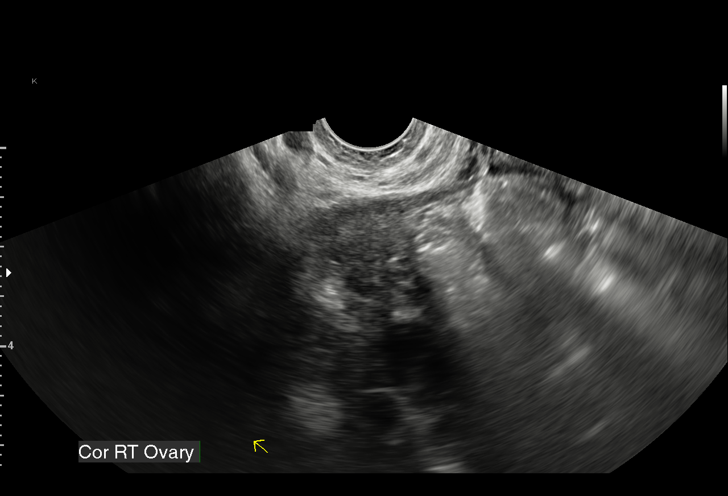
[im 57/57]
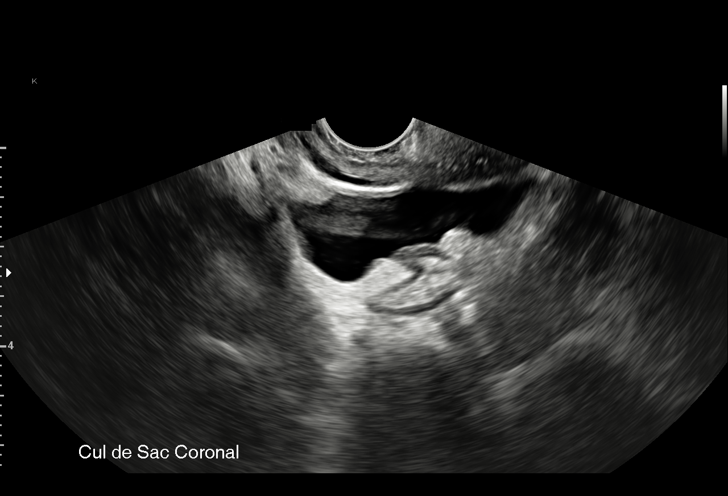

[15 of 28 positions shown; findings below may reference images not displayed]

FINDINGS: The uterus is anteverted and appears unremarkable.

The endometrium measures approximately 10 mm in thickness and
appears unremarkable. No intrauterine pregnancy identified.

The ovaries are unremarkable. There is a 1.9 cm corpus luteum in the
left ovary.

Small to moderate amount of free fluid noted within the pelvis.
IMPRESSION: No intrauterine pregnancy identified and no adnexal mass noted.
Findings consistent with a pregnancy of unknown location and
differential diagnosis includes: An early IUP, recent spontaneous
abortion, or an occult ectopic pregnancy. Clinical correlation and
follow-up with serial HCG levels and repeat ultrasound in 7 days, or
earlier if clinically indicated, recommended.
